# Patient Record
Sex: Male | Born: 1980 | Race: White | Hispanic: No | Marital: Single | Smoking: Current some day smoker
Health system: Southern US, Community
[De-identification: ages and names within clinical notes are randomized; demographics above are authoritative.]

## PROBLEM LIST (undated history)

## (undated) DIAGNOSIS — J45909 Unspecified asthma, uncomplicated: Secondary | ICD-10-CM

## (undated) DIAGNOSIS — R569 Unspecified convulsions: Secondary | ICD-10-CM

## (undated) HISTORY — PX: WISDOM TOOTH EXTRACTION: SHX21

## (undated) HISTORY — DX: Unspecified convulsions: R56.9

---

## 2004-01-02 ENCOUNTER — Emergency Department (HOSPITAL_COMMUNITY): Admission: EM | Admit: 2004-01-02 | Discharge: 2004-01-02 | Payer: Self-pay | Admitting: Emergency Medicine

## 2004-01-07 ENCOUNTER — Emergency Department (HOSPITAL_COMMUNITY): Admission: EM | Admit: 2004-01-07 | Discharge: 2004-01-07 | Payer: Self-pay | Admitting: Emergency Medicine

## 2004-01-17 ENCOUNTER — Encounter: Admission: RE | Admit: 2004-01-17 | Discharge: 2004-01-17 | Payer: Self-pay | Admitting: Gastroenterology

## 2005-04-27 ENCOUNTER — Emergency Department (HOSPITAL_COMMUNITY): Admission: EM | Admit: 2005-04-27 | Discharge: 2005-04-27 | Payer: Self-pay | Admitting: Emergency Medicine

## 2005-06-20 IMAGING — CT CT HEAD W/O CM
1 of 2 series · 13 of 30 positions shown, 17 images · non-contrast
Comparison: none

CLINICAL DATA: generalized malaise, pain; reported clinical concern for meningitis 
 TWO VIEW CHEST
 No priors for comparison.  Lungs are hyperaerated.  No infiltrate.  No cardiomediastinal silhouette and contours.
 IMPRESSION
 Hyperaerated lungs, suspicious for COPD.  
 CT HEAD SCAN WITHOUT CONTRAST MEDIA
 Given the patient?s young age, I feel that there are mild cortical atrophic changes of the cerebrum mainly the frontal lobes and minimal superior cerebellar vermian atrophy.  No acute intracranial abnormality.  No hydrocephalus.  
 No acute intracranial abnormality.  See comments above.

[Series 3: — · axial · 0.43mm/px · z∈[-134,-14]mm · 13 of 30 slices shown, 17 images]
[im 3/30  brain]
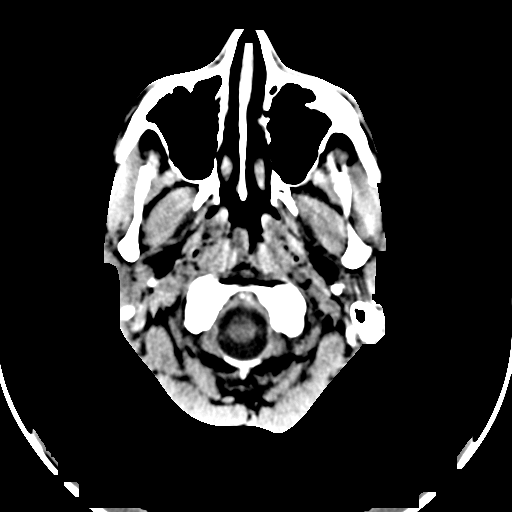
[im 3/30  bone]
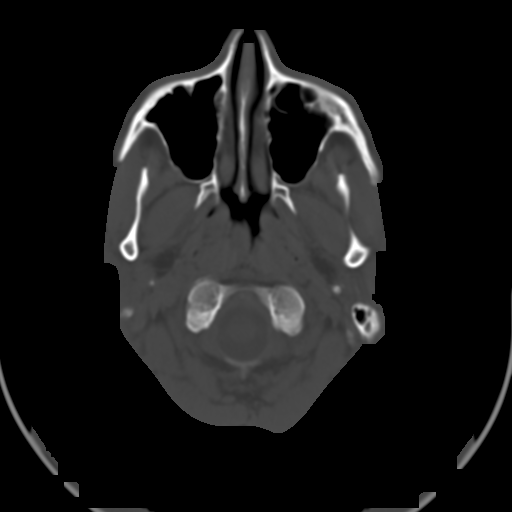
[im 5/30  brain]
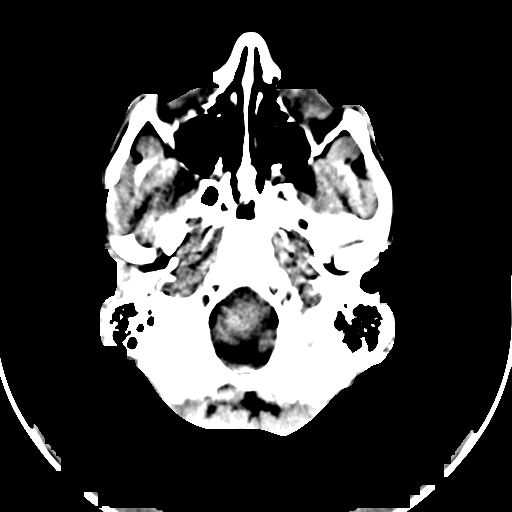
[im 7/30  brain]
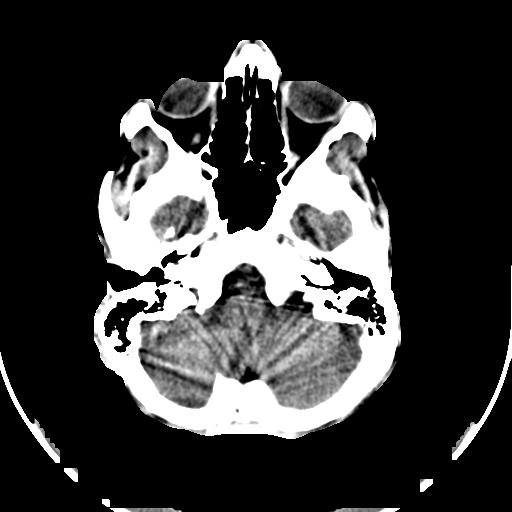
[im 9/30  brain]
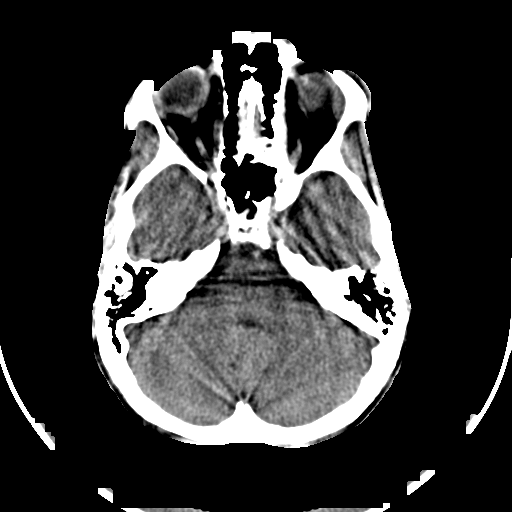
[im 11/30  brain]
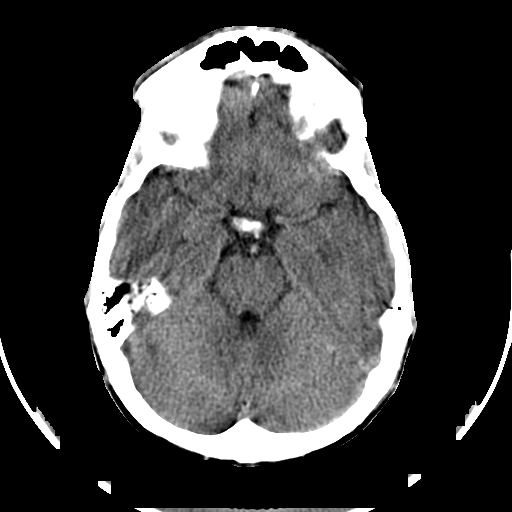
[im 11/30  bone]
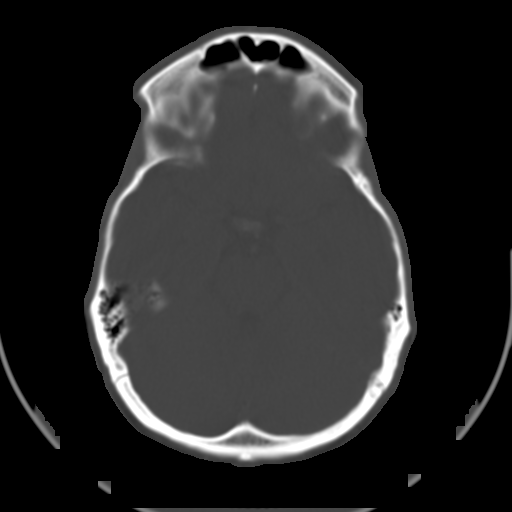
[im 13/30  brain]
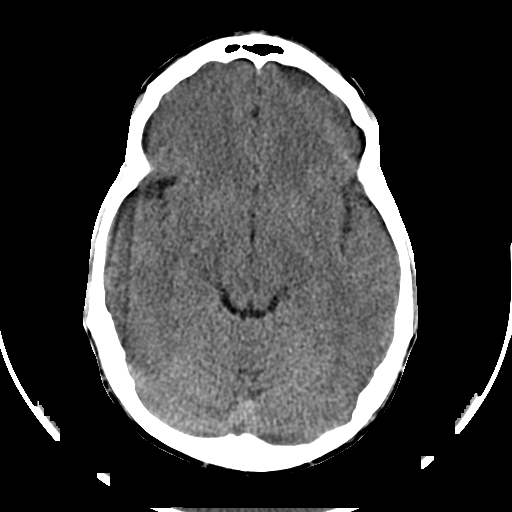
[im 15/30  brain]
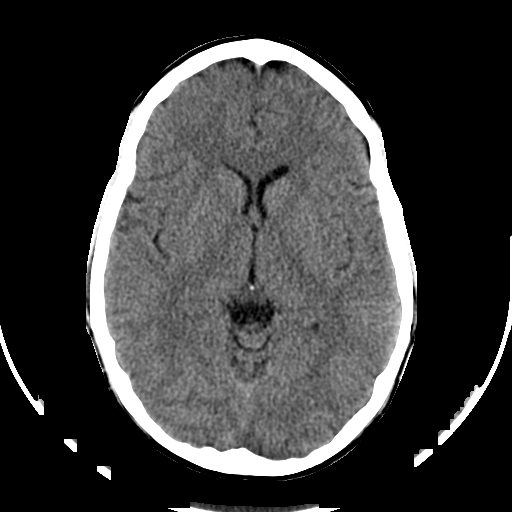
[im 17/30  brain]
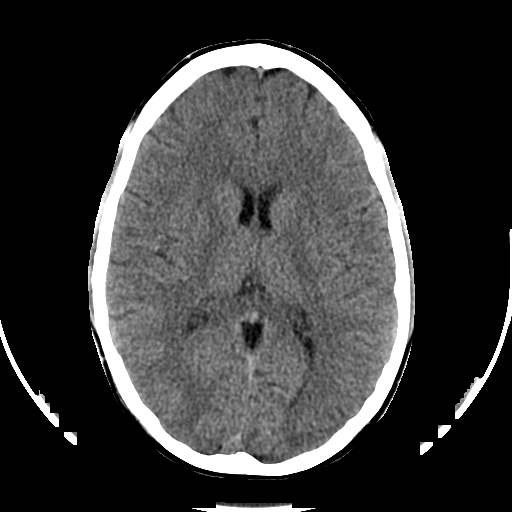
[im 19/30  brain]
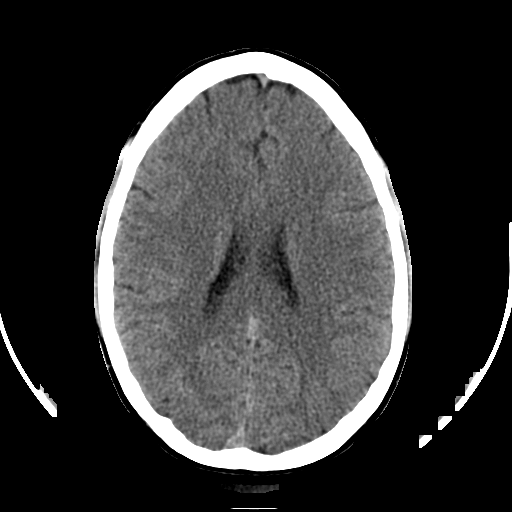
[im 19/30  bone]
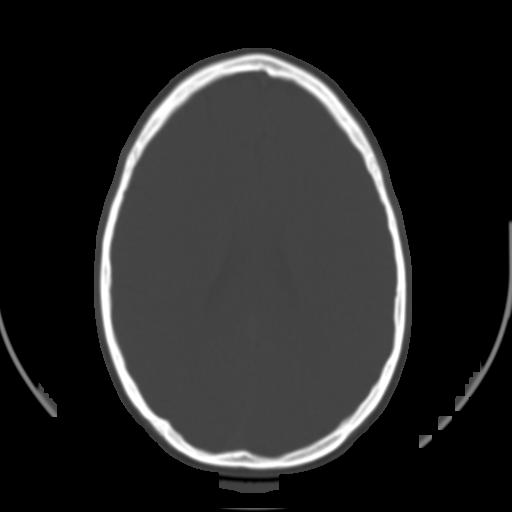
[im 21/30  brain]
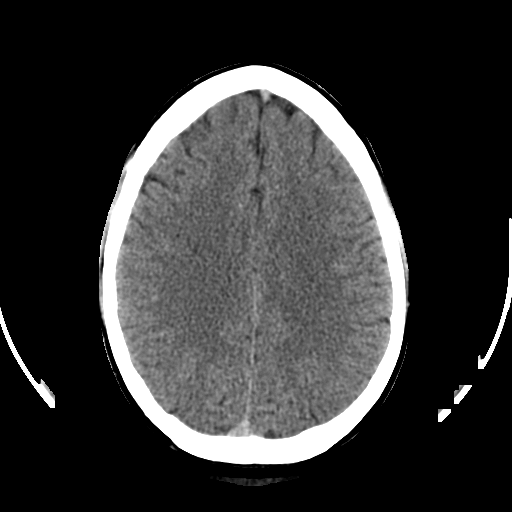
[im 23/30  brain]
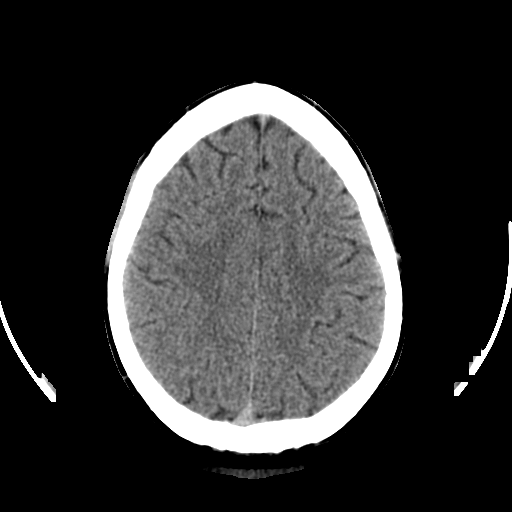
[im 25/30  brain]
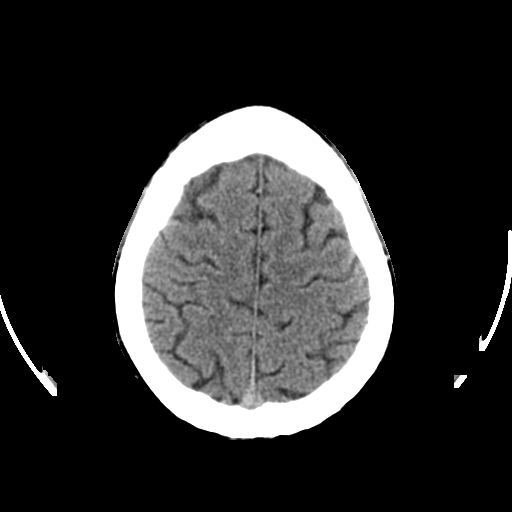
[im 27/30  brain]
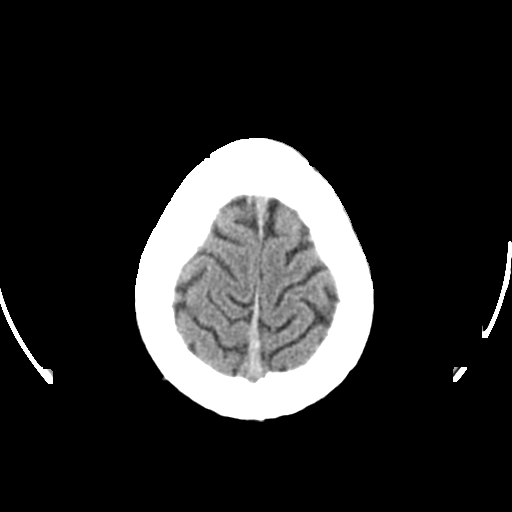
[im 27/30  bone]
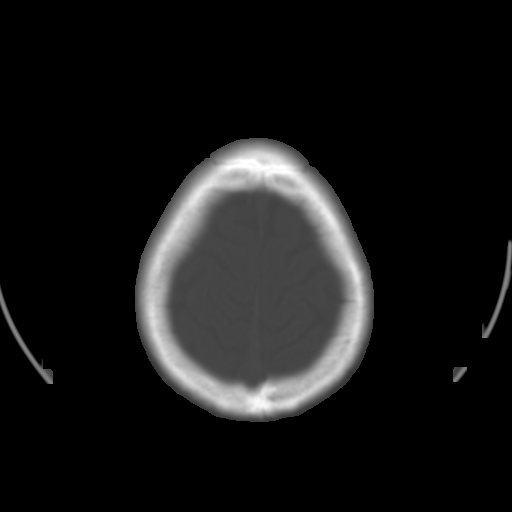

[13 of 30 positions shown; findings below may reference images not displayed]

## 2008-10-12 ENCOUNTER — Emergency Department (HOSPITAL_COMMUNITY): Admission: EM | Admit: 2008-10-12 | Discharge: 2008-10-12 | Payer: Self-pay | Admitting: Emergency Medicine

## 2008-10-18 ENCOUNTER — Inpatient Hospital Stay (HOSPITAL_COMMUNITY): Admission: EM | Admit: 2008-10-18 | Discharge: 2008-10-20 | Payer: Self-pay | Admitting: Emergency Medicine

## 2010-10-19 HISTORY — PX: URETHRA SURGERY: SHX824

## 2010-11-09 ENCOUNTER — Encounter: Payer: Self-pay | Admitting: Gastroenterology

## 2011-03-03 NOTE — H&P (Signed)
NAME:  Erik Hoffman, Erik Hoffman           ACCOUNT NO.:  [id number]   MEDICAL RECORD NO.:  [id number]          PATIENT TYPE:  INP   LOCATION:  1339                         FACILITY:  [facility]   PHYSICIAN:  Sigmund I. Patsi Sears, M.D.DATE OF BIRTH:  03/09/1981   DATE OF ADMISSION:  10/18/2008  DATE OF DISCHARGE:                              HISTORY & PHYSICAL   HISTORY:  Erik Hoffman is a 30 year old single male, was seen at [facility] on December 25 for 2 days of left testicular pain.  The patient had an ultrasound examination, and since had CT scan, which  show left epididymitis.  He was placed on Cipro, currently on  doxycycline.  He has had nausea, vomiting, and continued pain in his  left testicle.  The patient is unable to eat, unable to rest.  He has  had no fever and no chills.   PAST MEDICAL HISTORY:  Noncontributory.   ALLERGIES:  None.   SOCIAL HISTORY:  Tobacco, current.  THC:  Cannabis abuse.   REVIEW OF SYSTEMS:  12 systems otherwise noncontributory.   PHYSICAL EXAM:  CONSTITUTIONAL:  Shows a thin, well-developed, well-  nourished white male in no acute distress.  VITAL SIGNS:  Blood pressure is 115/70, temperature 98.3, pulse 68,  respiratory 18.  NECK:  Supple, nontender.  No nodes.  CHEST:  Clear to P and A.  ABDOMEN:  Soft, decreased bowel sounds without organomegaly or masses.  The patient complains of diffuse tenderness in the left inguinal area,  suprapubic area.  The complains of exquisite tenderness in his left  testicle and the left hemiscrotum.  Examination shows the patient had  mild erythema of his left hemiscrotum.  The testicle was nicely  palpated, and is normal.  The epididymis is tender to palpation.  I do  not feel heat, do not palpate a fluctuant abscess.  EXTREMITIES:  No cyanosis or edema.  PSYCH:  Normal orientation to time, person, place.   ASSESSMENT:  Patient with left epididymitis, poorly responsive to Cipro  and doxycycline.   He is unable to tolerate pain.   PLAN:  Admit for IV Rocephin and pain medication.  Dr. Annabell Howells to see  today.      Sigmund I. Patsi Sears, M.D.  Electronically Signed     SIT/MEDQ  D:  10/18/2008  T:  10/18/2008  Job:  [id number]   cc:   Excell Seltzer. Annabell Howells, M.D.  Fax: [phone]

## 2011-03-06 NOTE — Discharge Summary (Signed)
NAME:  Erik Hoffman, Erik Hoffman           ACCOUNT NO.:  [id number]   MEDICAL RECORD NO.:  [id number]          PATIENT TYPE:  INP   LOCATION:  1339                         FACILITY:  [facility]   PHYSICIAN:  Sigmund I. Patsi Sears, M.D.DATE OF BIRTH:  1981-01-30   DATE OF ADMISSION:  10/18/2008  DATE OF DISCHARGE:  10/20/2008                               DISCHARGE SUMMARY   ADMISSION DIAGNOSES:  Orchitis epididymitis, nausea and vomiting,  hydrocele.   DISCHARGE DIAGNOSIS:  Orchitis epididymitis, nausea and vomiting,  hydrocele.   BRIEF HISTORY:  Erik Hoffman is a 30 year old Caucasian male seen in  [facility] on December 25, for 2 days of left testicular pain.  He  at that time had ultrasound examination and since had a CT scan which  shows left epididymitis.  He was originally placed on Cipro then  switched to doxicycline.  However, he has had persistent nausea,  vomiting and continued pain in his left testicle.  He is unable to eat  and unable to rest.  He denies chills or fever at that time.  He does  admit to marijuana use.   PAST MEDICAL HISTORY:  None.   PAST SURGICAL HISTORY:  None.   ALLERGIES:  None.   HOSPITAL COURSE:  He was admitted on October 18, 2008 for continued  care under Dr. Jethro Bolus for continued left epididymitis poorly  responsive to Cipro and doxycycline.  He was started on IV Rocephin and  pain medications.  During his hospital stay, he continued to improve.  Pain lessened during his stay.  He is urinating well.  Vital signs  remained stable and he has had some nausea and vomiting.  However, that  is improved.  The fullness in the left scrotum remained present and  minimal discomfort with palpation.  He was changed to oral pain  medication and oral Septra.  He tolerated this well and was able to be  discharged home on October 20, 2008.   LABS ON ADMISSION:  December 31, white count 10.9, hemoglobin 15.5,  hematocrit 45.2, platelets 318,000.  Sodium  140, potassium 3.9, chloride  106, CO2 25, glucose 98, BUN 8, creatinine 0.81.   CONDITION ON DISCHARGE:  Improved.   DISCHARGE MEDICATIONS:  Septra DS p.o. b.i.d., Tylox, Phenergan.   ACTIVITY:  As tolerated.  No driving or lifting for 1 week.   DIET:  As tolerated.   WOUND CARE:  Not applicable.   FOLLOW UP:  With Dr. Patsi Sears or Jetta Lout NP-C in 1 week.  signed: 01/10/09 @ 4pm     ______________________________  Alessandra Bevels. Chase Picket, FNP-C      Sigmund I. Patsi Sears, M.D.  Electronically Signed    JML/MEDQ  D:  12/27/2008  T:  12/27/2008  Job:  [id number]

## 2011-07-24 LAB — DIFFERENTIAL
Basophils Absolute: 0.1 10*3/uL (ref 0.0–0.1)
Eosinophils Absolute: 0.2 10*3/uL (ref 0.0–0.7)
Eosinophils Relative: 2 % (ref 0–5)
Lymphocytes Relative: 13 % (ref 12–46)
Lymphs Abs: 1.5 10*3/uL (ref 0.7–4.0)
Lymphs Abs: 1.6 10*3/uL (ref 0.7–4.0)
Monocytes Relative: 9 % (ref 3–12)
Neutro Abs: 8.5 10*3/uL — ABNORMAL HIGH (ref 1.7–7.7)
Neutrophils Relative %: 76 % (ref 43–77)

## 2011-07-24 LAB — COMPREHENSIVE METABOLIC PANEL
BUN: 7 mg/dL (ref 6–23)
CO2: 29 mEq/L (ref 19–32)
Calcium: 9.3 mg/dL (ref 8.4–10.5)
Chloride: 98 mEq/L (ref 96–112)
Creatinine, Ser: 1.06 mg/dL (ref 0.4–1.5)
GFR calc non Af Amer: >60 mL/min (ref >60)
Total Bilirubin: 0.9 mg/dL (ref 0.3–1.2)

## 2011-07-24 LAB — URINALYSIS, ROUTINE W REFLEX MICROSCOPIC
Glucose, UA: NEGATIVE mg/dL
Hgb urine dipstick: NEGATIVE
Ketones, ur: NEGATIVE mg/dL
Nitrite: NEGATIVE
Protein, ur: NEGATIVE mg/dL
Protein, ur: NEGATIVE mg/dL
Urobilinogen, UA: 0.2 mg/dL (ref 0.0–1.0)
pH: 7 (ref 5.0–8.0)

## 2011-07-24 LAB — BASIC METABOLIC PANEL
BUN: 8 mg/dL (ref 6–23)
CO2: 25 mEq/L (ref 19–32)
Chloride: 106 mEq/L (ref 96–112)
Potassium: 3.9 mEq/L (ref 3.5–5.1)

## 2011-07-24 LAB — CBC
HCT: 45.2 % (ref 39.0–52.0)
HCT: 45.7 % (ref 39.0–52.0)
MCHC: 34.3 g/dL (ref 30.0–36.0)
MCV: 95.2 fL (ref 78.0–100.0)
MCV: 95.7 fL (ref 78.0–100.0)
Platelets: 318 10*3/uL (ref 150–400)
RBC: 4.72 MIL/uL (ref 4.22–5.81)
RBC: 4.8 MIL/uL (ref 4.22–5.81)
WBC: 10.9 10*3/uL — ABNORMAL HIGH (ref 4.0–10.5)
WBC: 11.2 10*3/uL — ABNORMAL HIGH (ref 4.0–10.5)

## 2011-07-24 LAB — PROTIME-INR
INR: 0.9 (ref 0.00–1.49)
Prothrombin Time: 12.7 seconds (ref 11.6–15.2)

## 2011-07-24 LAB — URINE MICROSCOPIC-ADD ON

## 2011-07-24 LAB — URINE CULTURE

## 2013-06-25 ENCOUNTER — Emergency Department (HOSPITAL_COMMUNITY): Payer: BC Managed Care – PPO

## 2013-06-25 ENCOUNTER — Emergency Department (HOSPITAL_COMMUNITY)
Admission: EM | Admit: 2013-06-25 | Discharge: 2013-06-25 | Disposition: A | Discharge status: Home / Self Care | Payer: BC Managed Care – PPO | Attending: Emergency Medicine | Admitting: Emergency Medicine

## 2013-06-25 ENCOUNTER — Encounter (HOSPITAL_COMMUNITY): Payer: Self-pay | Admitting: *Deleted

## 2013-06-25 DIAGNOSIS — S0990XA Unspecified injury of head, initial encounter: Secondary | ICD-10-CM | POA: Insufficient documentation

## 2013-06-25 DIAGNOSIS — S0993XA Unspecified injury of face, initial encounter: Secondary | ICD-10-CM | POA: Insufficient documentation

## 2013-06-25 DIAGNOSIS — F1010 Alcohol abuse, uncomplicated: Secondary | ICD-10-CM | POA: Insufficient documentation

## 2013-06-25 DIAGNOSIS — S3981XA Other specified injuries of abdomen, initial encounter: Secondary | ICD-10-CM | POA: Insufficient documentation

## 2013-06-25 DIAGNOSIS — F10929 Alcohol use, unspecified with intoxication, unspecified: Secondary | ICD-10-CM

## 2013-06-25 DIAGNOSIS — Y92410 Unspecified street and highway as the place of occurrence of the external cause: Secondary | ICD-10-CM | POA: Insufficient documentation

## 2013-06-25 DIAGNOSIS — IMO0002 Reserved for concepts with insufficient information to code with codable children: Secondary | ICD-10-CM | POA: Insufficient documentation

## 2013-06-25 DIAGNOSIS — F17200 Nicotine dependence, unspecified, uncomplicated: Secondary | ICD-10-CM | POA: Insufficient documentation

## 2013-06-25 DIAGNOSIS — J45909 Unspecified asthma, uncomplicated: Secondary | ICD-10-CM | POA: Insufficient documentation

## 2013-06-25 DIAGNOSIS — Y9389 Activity, other specified: Secondary | ICD-10-CM | POA: Insufficient documentation

## 2013-06-25 DIAGNOSIS — S8990XA Unspecified injury of unspecified lower leg, initial encounter: Secondary | ICD-10-CM | POA: Insufficient documentation

## 2013-06-25 HISTORY — DX: Unspecified asthma, uncomplicated: J45.909

## 2013-06-25 LAB — POCT I-STAT, CHEM 8
BUN: 13 mg/dL (ref 6–23)
Chloride: 103 mEq/L (ref 96–112)
Glucose, Bld: 91 mg/dL (ref 70–99)
HCT: 43 % (ref 39.0–52.0)
Potassium: 4.1 mEq/L (ref 3.5–5.1)

## 2013-06-25 MED ORDER — DIAZEPAM 5 MG PO TABS: 5.0000 mg | ORAL_TABLET | Freq: Three times a day (TID) | ORAL | Status: DC | PRN

## 2013-06-25 MED ORDER — IOHEXOL 300 MG/ML  SOLN
100.0000 mL | Freq: Once | INTRAMUSCULAR | Status: AC | PRN
  Administered 2013-06-25: 100 mL via INTRAVENOUS

## 2013-06-25 MED ORDER — IBUPROFEN 800 MG PO TABS: 800.0000 mg | ORAL_TABLET | Freq: Three times a day (TID) | ORAL | Status: DC | PRN
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
## 2013-06-25 NOTE — ED Provider Notes (Signed)
Medical screening examination/treatment/procedure(s) were performed by non-physician practitioner and as supervising physician I was immediately available for consultation/collaboration.  Toy Baker, MD 06/25/13 2032
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
## 2013-12-29 ENCOUNTER — Emergency Department: Payer: Self-pay | Admitting: Emergency Medicine

## 2013-12-29 LAB — CBC
HCT: 44.4 % (ref 40.0–52.0)
HGB: 15.4 g/dL (ref 13.0–18.0)
MCH: 31.2 pg (ref 26.0–34.0)
MCHC: 34.6 g/dL (ref 32.0–36.0)
MCV: 90 fL (ref 80–100)
Platelet: 248 10*3/uL (ref 150–440)
RBC: 4.92 10*6/uL (ref 4.40–5.90)
RDW: 13.6 % (ref 11.5–14.5)
WBC: 8.2 10*3/uL (ref 3.8–10.6)

## 2013-12-29 LAB — COMPREHENSIVE METABOLIC PANEL
ALBUMIN: 4.2 g/dL (ref 3.4–5.0)
ALK PHOS: 53 U/L
ALT: 30 U/L (ref 12–78)
Anion Gap: 11 (ref 7–16)
BUN: 12 mg/dL (ref 7–18)
Bilirubin,Total: 0.5 mg/dL (ref 0.2–1.0)
CHLORIDE: 107 mmol/L (ref 98–107)
CREATININE: 1.49 mg/dL — AB (ref 0.60–1.30)
Calcium, Total: 8.7 mg/dL (ref 8.5–10.1)
Co2: 20 mmol/L — ABNORMAL LOW (ref 21–32)
EGFR (Non-African Amer.): >60
GLUCOSE: 94 mg/dL (ref 65–99)
Osmolality: 275 (ref 275–301)
Potassium: 3.9 mmol/L (ref 3.5–5.1)
SGOT(AST): 21 U/L (ref 15–37)
Sodium: 138 mmol/L (ref 136–145)
Total Protein: 7.8 g/dL (ref 6.4–8.2)

## 2013-12-29 LAB — DRUG SCREEN, URINE
Amphetamines, Ur Screen: NEGATIVE (ref ?–1000)
BARBITURATES, UR SCREEN: NEGATIVE (ref ?–200)
Benzodiazepine, Ur Scrn: NEGATIVE (ref ?–200)
Cannabinoid 50 Ng, Ur ~~LOC~~: POSITIVE (ref ?–50)
Cocaine Metabolite,Ur ~~LOC~~: NEGATIVE (ref ?–300)
MDMA (ECSTASY) UR SCREEN: NEGATIVE (ref ?–500)
Methadone, Ur Screen: NEGATIVE (ref ?–300)
OPIATE, UR SCREEN: POSITIVE (ref ?–300)
Phencyclidine (PCP) Ur S: NEGATIVE (ref ?–25)
TRICYCLIC, UR SCREEN: NEGATIVE (ref ?–1000)

## 2013-12-29 LAB — TROPONIN I: Troponin-I: <0.02 ng/mL

## 2013-12-30 LAB — URINALYSIS, COMPLETE
BACTERIA: NONE SEEN
BILIRUBIN, UR: NEGATIVE
Blood: NEGATIVE
Glucose,UR: NEGATIVE mg/dL (ref 0–75)
Ketone: NEGATIVE
Leukocyte Esterase: NEGATIVE
Nitrite: NEGATIVE
PH: 5 (ref 4.5–8.0)
PROTEIN: NEGATIVE
RBC,UR: 1 /HPF (ref 0–5)
Specific Gravity: 1.019 (ref 1.003–1.030)
WBC UR: 2 /HPF (ref 0–5)

## 2014-09-30 ENCOUNTER — Emergency Department: Payer: Self-pay | Admitting: Emergency Medicine

## 2015-01-24 ENCOUNTER — Ambulatory Visit: Payer: 59 | Admitting: Neurology

## 2015-01-24 ENCOUNTER — Telehealth: Payer: Self-pay

## 2015-01-24 NOTE — Telephone Encounter (Signed)
Patient did not come to a new patient appointment. 

## 2015-01-28 ENCOUNTER — Encounter: Payer: Self-pay | Admitting: Neurology

## 2015-02-12 ENCOUNTER — Encounter: Payer: Self-pay | Admitting: Neurology

## 2015-02-12 ENCOUNTER — Ambulatory Visit (INDEPENDENT_AMBULATORY_CARE_PROVIDER_SITE_OTHER): Payer: 59 | Admitting: Neurology

## 2015-02-12 VITALS — BP 104/70 | HR 59 | Resp 16 | Ht 69.0 in | Wt 186.0 lb

## 2015-02-12 DIAGNOSIS — G40309 Generalized idiopathic epilepsy and epileptic syndromes, not intractable, without status epilepticus: Secondary | ICD-10-CM

## 2015-02-12 MED ORDER — VALPROIC ACID 500 MG PO CPDR: DELAYED_RELEASE_CAPSULE | ORAL | Status: DC
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
## 2015-02-12 NOTE — Patient Instructions (Addendum)
1. Start Depakote DR 500mg  twice a day 2. Practice good sleep hygiene, start melatonin 3mg  daily 3. Records from Dr. Malvin JohnsPotter will be requested for review 4. Follow-up in 2 months, call for any problems  Seizure Precautions: 1. If medication has been prescribed for you to prevent seizures, take it exactly as directed.  Do not stop taking the medicine without talking to your doctor first, even if you have not had a seizure in a long time.   2. Avoid activities in which a seizure would cause danger to yourself or to others.  Don't operate dangerous machinery, swim alone, or climb in high or dangerous places, such as on ladders, roofs, or girders.  Do not drive unless your doctor says you may.  3. If you have any warning that you may have a seizure, lay down in a safe place where you can't hurt yourself.    4.  No driving for 6 months from last seizure, as per [place] law.   Please refer to the following link on the Epile[employer] of [employer]'s website for more information: http://www.epilepsyfoundation.org/answerplace/Social/driving/drivingu.cfm   5.  Maintain good sleep hygiene.  6.  Contact your doctor if you have any problems that may be related to the medicine you are taking.  7.  Call 911 and bring the patient back to the ED if:        A.  The seizure lasts longer than 5 minutes.       B.  The patient doesn't awaken shortly after the seizure  C.  The patient has new problems such as difficulty seeing, speaking or moving  D.  The patient was injured during the seizure  E.  The patient has a temperature over 102 F (39C)  F.  The patient vomited and now is having trouble breathing

## 2015-03-05 ENCOUNTER — Telehealth: Payer: Self-pay | Admitting: Neurology

## 2015-03-05 NOTE — Telephone Encounter (Signed)
Review[facility]: EEG done 02/09/14 was abnormal with frequent approximately 1 second bursts of generalized 5Hz  spike and wave discharges. Hyperventilation induces spike wave activity. 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

## 2015-04-02 ENCOUNTER — Ambulatory Visit: Payer: 59 | Admitting: Neurology

## 2015-04-02 DIAGNOSIS — Z029 Encounter for administrative examinations, unspecified: Secondary | ICD-10-CM

## 2015-04-03 ENCOUNTER — Encounter: Payer: Self-pay | Admitting: Neurology

## 2016-03-01 ENCOUNTER — Encounter (HOSPITAL_COMMUNITY): Payer: Self-pay

## 2016-03-01 ENCOUNTER — Emergency Department (HOSPITAL_COMMUNITY)
Admission: EM | Admit: 2016-03-01 | Discharge: 2016-03-01 | Disposition: A | Discharge status: Home / Self Care | Payer: No Typology Code available for payment source | Attending: Emergency Medicine | Admitting: Emergency Medicine

## 2016-03-01 DIAGNOSIS — G40909 Epilepsy, unspecified, not intractable, without status epilepticus: Secondary | ICD-10-CM | POA: Insufficient documentation

## 2016-03-01 DIAGNOSIS — J45909 Unspecified asthma, uncomplicated: Secondary | ICD-10-CM | POA: Insufficient documentation

## 2016-03-01 DIAGNOSIS — R569 Unspecified convulsions: Secondary | ICD-10-CM

## 2016-03-01 DIAGNOSIS — F17210 Nicotine dependence, cigarettes, uncomplicated: Secondary | ICD-10-CM | POA: Insufficient documentation

## 2016-03-01 DIAGNOSIS — Z79899 Other long term (current) drug therapy: Secondary | ICD-10-CM | POA: Insufficient documentation

## 2016-03-01 LAB — CBC WITH DIFFERENTIAL/PLATELET
Basophils Absolute: 0.1 10*3/uL (ref 0.0–0.1)
Basophils Relative: 1 %
EOS ABS: 0.1 10*3/uL (ref 0.0–0.7)
EOS PCT: 1 %
HCT: 41.5 % (ref 39.0–52.0)
Hemoglobin: 14.3 g/dL (ref 13.0–17.0)
LYMPHS ABS: 1.1 10*3/uL (ref 0.7–4.0)
Lymphocytes Relative: 10 %
MCH: 31.5 pg (ref 26.0–34.0)
MCHC: 34.5 g/dL (ref 30.0–36.0)
MCV: 91.4 fL (ref 78.0–100.0)
MONOS PCT: 9 %
Monocytes Absolute: 0.9 10*3/uL (ref 0.1–1.0)
Neutro Abs: 8.6 10*3/uL — ABNORMAL HIGH (ref 1.7–7.7)
Neutrophils Relative %: 79 %
PLATELETS: 227 10*3/uL (ref 150–400)
RBC: 4.54 MIL/uL (ref 4.22–5.81)
RDW: 12.8 % (ref 11.5–15.5)
WBC: 10.8 10*3/uL — AB (ref 4.0–10.5)

## 2016-03-01 LAB — BASIC METABOLIC PANEL
Anion gap: 7 (ref 5–15)
BUN: 15 mg/dL (ref 6–20)
CALCIUM: 8.8 mg/dL — AB (ref 8.9–10.3)
CHLORIDE: 105 mmol/L (ref 101–111)
CO2: 27 mmol/L (ref 22–32)
CREATININE: 1.04 mg/dL (ref 0.61–1.24)
GFR calc non Af Amer: >60 mL/min (ref >60)
Glucose, Bld: 113 mg/dL — ABNORMAL HIGH (ref 65–99)
Potassium: 4.3 mmol/L (ref 3.5–5.1)
SODIUM: 139 mmol/L (ref 135–145)

## 2016-03-01 LAB — CBG MONITORING, ED: Glucose-Capillary: 108 mg/dL — ABNORMAL HIGH (ref 65–99)

## 2016-03-01 NOTE — ED Notes (Signed)
Patient was at home and had a seizure around 0930 or 1000. Patient's family stated they saw patient post seizure and called EMS. Patient refused to be transported to the ED. Patient c/o headache. No tongue injury.

## 2016-03-01 NOTE — Discharge Instructions (Signed)
Follow-up with your neurologist this week. Call on Monday to arrange this appointment.   Seizure, Adult A seizure is abnormal electrical activity in the brain. Seizures usually last from 30 seconds to 2 minutes. There are various types of seizures. Before a seizure, you may have a warning sensation (aura) that a seizure is about to occur. An aura may include the following symptoms:   Fear or anxiety.  Nausea.  Feeling like the room is spinning (vertigo).  Vision changes, such as seeing flashing lights or spots. Common symptoms during a seizure include:  A change in attention or behavior (altered mental status).  Convulsions with rhythmic jerking movements.  Drooling.  Rapid eye movements.  Grunting.  Loss of bladder and bowel control.  Bitter taste in the mouth.  Tongue biting. After a seizure, you may feel confused and sleepy. You may also have an injury resulting from convulsions during the seizure. HOME CARE INSTRUCTIONS   If you are given medicines, take them exactly as prescribed by your health care provider.  Keep all follow-up appointments as directed by your health care provider.  Do not swim or drive or engage in risky activity during which a seizure could cause further injury to you or others until your health care provider says it is OK.  Get adequate rest.  Teach friends and family what to do if you have a seizure. They should:  Lay you on the ground to prevent a fall.  Put a cushion under your head.  Loosen any tight clothing around your neck.  Turn you on your side. If vomiting occurs, this helps keep your airway clear.  Stay with you until you recover.  Know whether or not you need emergency care. SEEK IMMEDIATE MEDICAL CARE IF:  The seizure lasts longer than 5 minutes.  The seizure is severe or you do not wake up immediately after the seizure.  You have an altered mental status after the seizure.  You are having more frequent or worsening  seizures. Someone should drive you to the emergency department or call local emergency services (911 in [place] MAKE SURE YOU:  Understand these instructions.  Will watch your condition.  Will get help right away if you are not doing well or get worse.   This information is not intended to replace advice given to you by your health care provider. Make sure you discuss any questions you have with your health care provider.   Document Released: 10/02/2000 Document Revised: 10/26/2014 Document Reviewed: 05/17/2013 Elsevier Interactive Patient Education [employer]2016 [employer]vier Inc.

## 2016-03-01 NOTE — ED Provider Notes (Signed)
CSN: [id number]     Arrival date & time 03/01/16  1048 History   First MD Initiated Contact with Patient 03/01/16 1153     Chief Complaint  Patient presents with  . Seizures     (Consider location/radiation/quality/duration/timing/severity/associated sxs/prior Treatment) HPI Comments: Patient is a 35 year old male with history of seizures. Her seizure was 2 years ago. He was initially started on Keppra she could not afford, then changed to Depakote which made him feel bad, so he has not been taking this for approximately one year. He reports starting a new job recently and this has interrupted his sleep patterns. This morning he was found by his family to be having a grand mal seizure. He reports shaking all over and foaming at the mouth. They also report that he turned blue. EMS was called and this resolved in approximately 10 minutes. Patient currently has no complaints.  Patient is a 35 y.o. male presenting with seizures. The history is provided by the patient.  Seizures Seizure activity on arrival: no   Seizure type:  Grand mal Preceding symptoms: no headache, no nausea and no vision change   Initial focality:  None Postictal symptoms: no confusion   Severity:  Moderate Duration:  10 minutes Timing:  Once Progression:  Resolved Recent head injury:  No recent head injuries PTA treatment:  None History of seizures: yes   Similar to previous episodes: yes     Past Medical History  Diagnosis Date  . Asthma   . Seizure [facility])    Past Surgical History  Procedure Laterality Date  . Urethra surgery  2012  . Wisdom tooth extraction     Family History  Problem Relation Age of Onset  . Hypothyroidism Mother   . Mesothelioma Father    Social History  Substance Use Topics  . Smoking status: Current Every Day Smoker -- 0.50 packs/day for 15 years    Types: Cigarettes  . Smokeless tobacco: Never Used  . Alcohol Use: 0.0 oz/week    0 Standard drinks or equivalent per week   Comment: Rare    Review of Systems  Neurological: Positive for seizures.  All other systems reviewed and are negative.     Allergies  Review of patient's allergies indicates no known allergies.  Home Medications   Prior to Admission medications   Medication Sig Start Date End Date Taking? Authorizing Provider  MELATONIN PO Take 1 capsule by mouth at bedtime as needed (sleep).   Yes Historical Provider, MD  Valproic Acid 500 MG CPDR Take 1 capsule twice a day 02/12/15  Yes Van Clines, MD   BP 111/78 mmHg  Pulse 76  Temp(Src) 97.9 F (36.6 C) (Oral)  Resp 17  SpO2 98% Physical Exam  Constitutional: He is oriented to person, place, and time. He appears well-developed and well-nourished. No distress.  HENT:  Head: Normocephalic and atraumatic.  Mouth/Throat: Oropharynx is clear and moist.  There is no evidence for oral trauma.  Neck: Normal range of motion. Neck supple.  Cardiovascular: Normal rate and regular rhythm.  Exam reveals no friction rub.   No murmur heard. Pulmonary/Chest: Effort normal and breath sounds normal. No respiratory distress. He has no wheezes. He has no rales.  Abdominal: Soft. Bowel sounds are normal. He exhibits no distension. There is no tenderness.  Musculoskeletal: Normal range of motion. He exhibits no edema.  Neurological: He is alert and oriented to person, place, and time. No cranial nerve deficit. He exhibits normal muscle tone. Coordination  normal.  Skin: Skin is warm and dry. He is not diaphoretic.  Nursing note and vitals reviewed.   ED Course  Procedures (including critical care time) Labs Review Labs Reviewed  BASIC METABOLIC PANEL - Abnormal; Notable for the following:    Glucose, Bld 113 (*)    Calcium 8.8 (*)    All other components within normal limits  CBC WITH DIFFERENTIAL/PLATELET - Abnormal; Notable for the following:    WBC 10.8 (*)    Neutro Abs 8.6 (*)    All other components within normal limits  CBG MONITORING,  ED - Abnormal; Notable for the following:    Glucose-Capillary 108 (*)    All other components within normal limits  CBG MONITORING, ED    Imaging Review No results found. I have personally reviewed and evaluated these images and lab results as part of my medical decision-making.   EKG Interpretation   Date/Time:  Sunday Mar 01 2016 11:02:23 EDT Ventricular Rate:  72 PR Interval:  149 QRS Duration: 90 QT Interval:  388 QTC Calculation: 425 R Axis:   79 Text Interpretation:  Sinus rhythm ST elev, probable normal early repol  pattern since last tracing no significant change Confirmed by Effie ShyWENTZ  MD,  ELLIOTT ([id number]) on 03/01/2016 11:05:45 AM      MDM   Final diagnoses:  None    Patient was brought here after a seizure by EMS. His workup reveals normal laboratory studies. He has had a full seizure workup in the past, however no cause was found. He is noncompliant with his medications and has informed me that he will not take anything that is prescribed and is requesting and "herbal medication." I discussed this case with Dr. Lavon PaganiniNandigam from neurology who is recommending outpatient follow-up. Again, the patient declines any prescription medication. He understands that this could result in another seizure and possibly disability/death. He is to follow-up with his neurologist.    Erik Lyonsouglas Jazir Newey, MD 03/01/16 1425

## 2016-03-01 NOTE — ED Notes (Signed)
Patient states he is suppose to be taking depakote, but not taking medication . Patient states he has not had a seizure in a year.

## 2016-03-04 ENCOUNTER — Encounter: Payer: Self-pay | Admitting: Diagnostic Neuroimaging

## 2016-03-04 ENCOUNTER — Ambulatory Visit (INDEPENDENT_AMBULATORY_CARE_PROVIDER_SITE_OTHER): Payer: BLUE CROSS/BLUE SHIELD | Admitting: Diagnostic Neuroimaging

## 2016-03-04 VITALS — BP 128/79 | HR 66 | Ht 69.0 in | Wt 183.8 lb

## 2016-03-04 DIAGNOSIS — G40309 Generalized idiopathic epilepsy and epileptic syndromes, not intractable, without status epilepticus: Secondary | ICD-10-CM

## 2016-03-04 MED ORDER — DIVALPROEX SODIUM 500 MG PO DR TAB: 500.0000 mg | DELAYED_RELEASE_TABLET | Freq: Two times a day (BID) | ORAL | Status: DC
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
## 2016-03-04 NOTE — Patient Instructions (Signed)
Thank you for coming to see [place] at [facility]. I hope we have been able to provide you high quality care today.  You may receive a patient satisfaction survey over the next few weeks. We would appreciate your feedback and comments so that we may continue to improve ourselves and the health of our patients.  - start divalproex 582m twice a day - no driving until seizure free x 6 months   ~~~~~~~~~~~~~~~~~~~~~~~~~~~~~~~~~~~~~~~~~~~~~~~~~~~~~~~~~~~~~~~~~  DR. PENUMALLI'S GUIDE TO HAPPY AND HEALTHY LIVING These are some of my general health and wellness recommendations. Some of them may apply to you better than others. Please use common sense as you try these suggestions and feel free to ask me any questions.   ACTIVITY/FITNESS Mental, social, emotional and physical stimulation are very important for brain and body health. Try learning a new activity (arts, music, language, sports, games).  Keep moving your body to the best of your abilities. You can do this at home, inside or outside, the park, community center, gym or anywhere you like. Consider a physical therapist or personal trainer to get started. Consider the app Sworkit. Fitness trackers such as smart-watches, smart-phones or Fitbits can help as well.   NUTRITION Eat more plants: colorful vegetables, nuts, seeds and berries.  Eat less sugar, salt, preservatives and processed foods.  Avoid toxins such as cigarettes and alcohol.  Drink water when you are thirsty. Warm water with a slice of lemon is an excellent morning drink to start the day.  Consider these websites for more information The Nutrition Source (hhttps://www.henry-hernandez.biz/ [employer] (wWindowBlog.ch   RELAXATION Consider practicing mindfulness meditation or other relaxation techniques such as deep breathing, prayer, yoga, tai chi, massage. See website mindful.org or the apps Headspace or  Calm to help get started.   SLEEP Try to get at least 7-8+ hours sleep per day. Regular exercise and reduced caffeine will help you sleep better. Practice good sleep hygeine techniques. See website sleep.org for more information.   PLANNING Prepare estate planning, living will, healthcare POA documents. Sometimes this is best planned with the help of an attorney. Theconversationproject.org and agingwithdignity.org are excellent resources.

## 2016-03-05 ENCOUNTER — Encounter: Payer: Self-pay | Admitting: Diagnostic Neuroimaging

## 2016-06-05 ENCOUNTER — Ambulatory Visit (INDEPENDENT_AMBULATORY_CARE_PROVIDER_SITE_OTHER): Payer: BLUE CROSS/BLUE SHIELD | Admitting: Diagnostic Neuroimaging

## 2016-06-05 ENCOUNTER — Encounter: Payer: Self-pay | Admitting: Diagnostic Neuroimaging

## 2016-06-05 VITALS — BP 125/83 | HR 89 | Wt 170.6 lb

## 2016-06-05 DIAGNOSIS — G40309 Generalized idiopathic epilepsy and epileptic syndromes, not intractable, without status epilepticus: Secondary | ICD-10-CM

## 2016-06-05 MED ORDER — DIVALPROEX SODIUM 500 MG PO DR TAB: 500.0000 mg | DELAYED_RELEASE_TABLET | Freq: Two times a day (BID) | ORAL | 4 refills | Status: DC

## 2016-06-05 NOTE — Patient Instructions (Signed)
continue current medication
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
## 2016-06-06 LAB — CBC WITH DIFFERENTIAL/PLATELET
BASOS ABS: 0.2 10*3/uL (ref 0.0–0.2)
Basos: 2 %
EOS (ABSOLUTE): 0.2 10*3/uL (ref 0.0–0.4)
Eos: 2 %
Hematocrit: 42.5 % (ref 37.5–51.0)
Hemoglobin: 14.1 g/dL (ref 12.6–17.7)
Immature Grans (Abs): 0 10*3/uL (ref 0.0–0.1)
Immature Granulocytes: 1 %
LYMPHS ABS: 2 10*3/uL (ref 0.7–3.1)
Lymphs: 26 %
MCH: 30.8 pg (ref 26.6–33.0)
MCHC: 33.2 g/dL (ref 31.5–35.7)
MCV: 93 fL (ref 79–97)
MONOCYTES: 7 %
MONOS ABS: 0.5 10*3/uL (ref 0.1–0.9)
Neutrophils Absolute: 4.6 10*3/uL (ref 1.4–7.0)
Neutrophils: 62 %
PLATELETS: 271 10*3/uL (ref 150–379)
RBC: 4.58 x10E6/uL (ref 4.14–5.80)
RDW: 13.5 % (ref 12.3–15.4)
WBC: 7.4 10*3/uL (ref 3.4–10.8)

## 2016-06-06 LAB — COMPREHENSIVE METABOLIC PANEL
ALK PHOS: 44 IU/L (ref 39–117)
ALT: 8 IU/L (ref 0–44)
AST: 8 IU/L (ref 0–40)
Albumin/Globulin Ratio: 2 (ref 1.2–2.2)
Albumin: 4.3 g/dL (ref 3.5–5.5)
BUN/Creatinine Ratio: 10 (ref 9–20)
BUN: 11 mg/dL (ref 6–20)
Bilirubin Total: 0.4 mg/dL (ref 0.0–1.2)
CO2: 24 mmol/L (ref 18–29)
CREATININE: 1.1 mg/dL (ref 0.76–1.27)
Calcium: 9.1 mg/dL (ref 8.7–10.2)
Chloride: 101 mmol/L (ref 96–106)
GFR calc Af Amer: 101 mL/min/{1.73_m2} (ref >59)
GFR calc non Af Amer: 87 mL/min/{1.73_m2} (ref >59)
GLUCOSE: 144 mg/dL — AB (ref 65–99)
Globulin, Total: 2.2 g/dL (ref 1.5–4.5)
Potassium: 4.3 mmol/L (ref 3.5–5.2)
Sodium: 141 mmol/L (ref 134–144)
Total Protein: 6.5 g/dL (ref 6.0–8.5)

## 2016-06-08 ENCOUNTER — Telehealth: Payer: Self-pay | Admitting: *Deleted

## 2016-06-08 NOTE — Telephone Encounter (Signed)
Per Dr Marjory LiesPenumalli, spoke with patient and informed him his lab results are good with the exception of somewhat elevated sugar/glucose. Advised he watch his sugar intake both with foods and drinks.  He verbalized understanding, appreciation.

## 2016-12-07 ENCOUNTER — Ambulatory Visit: Payer: BLUE CROSS/BLUE SHIELD | Admitting: Diagnostic Neuroimaging

## 2016-12-23 ENCOUNTER — Emergency Department (HOSPITAL_COMMUNITY): Payer: BLUE CROSS/BLUE SHIELD

## 2016-12-23 ENCOUNTER — Encounter (HOSPITAL_COMMUNITY): Payer: Self-pay

## 2016-12-23 ENCOUNTER — Emergency Department (HOSPITAL_COMMUNITY)
Admission: EM | Admit: 2016-12-23 | Discharge: 2016-12-23 | Disposition: A | Discharge status: Home / Self Care | Payer: BLUE CROSS/BLUE SHIELD | Attending: Emergency Medicine | Admitting: Emergency Medicine

## 2016-12-23 DIAGNOSIS — W228XXA Striking against or struck by other objects, initial encounter: Secondary | ICD-10-CM | POA: Diagnosis not present

## 2016-12-23 DIAGNOSIS — Y939 Activity, unspecified: Secondary | ICD-10-CM | POA: Diagnosis not present

## 2016-12-23 DIAGNOSIS — Y929 Unspecified place or not applicable: Secondary | ICD-10-CM | POA: Insufficient documentation

## 2016-12-23 DIAGNOSIS — J45909 Unspecified asthma, uncomplicated: Secondary | ICD-10-CM | POA: Insufficient documentation

## 2016-12-23 DIAGNOSIS — Z79899 Other long term (current) drug therapy: Secondary | ICD-10-CM | POA: Diagnosis not present

## 2016-12-23 DIAGNOSIS — R0781 Pleurodynia: Secondary | ICD-10-CM | POA: Diagnosis not present

## 2016-12-23 DIAGNOSIS — R51 Headache: Secondary | ICD-10-CM | POA: Insufficient documentation

## 2016-12-23 DIAGNOSIS — Z23 Encounter for immunization: Secondary | ICD-10-CM | POA: Insufficient documentation

## 2016-12-23 DIAGNOSIS — R569 Unspecified convulsions: Secondary | ICD-10-CM | POA: Diagnosis not present

## 2016-12-23 DIAGNOSIS — Y999 Unspecified external cause status: Secondary | ICD-10-CM | POA: Insufficient documentation

## 2016-12-23 DIAGNOSIS — F17210 Nicotine dependence, cigarettes, uncomplicated: Secondary | ICD-10-CM | POA: Insufficient documentation

## 2016-12-23 DIAGNOSIS — W19XXXA Unspecified fall, initial encounter: Secondary | ICD-10-CM

## 2016-12-23 LAB — CBC WITH DIFFERENTIAL/PLATELET
BASOS ABS: 0.1 10*3/uL (ref 0.0–0.1)
BASOS PCT: 1 %
EOS ABS: 0 10*3/uL (ref 0.0–0.7)
EOS PCT: 0 %
HCT: 41.5 % (ref 39.0–52.0)
Hemoglobin: 14.9 g/dL (ref 13.0–17.0)
LYMPHS PCT: 7 %
Lymphs Abs: 1.2 10*3/uL (ref 0.7–4.0)
MCH: 31.6 pg (ref 26.0–34.0)
MCHC: 35.9 g/dL (ref 30.0–36.0)
MCV: 88.1 fL (ref 78.0–100.0)
Monocytes Absolute: 1 10*3/uL (ref 0.1–1.0)
Monocytes Relative: 6 %
Neutro Abs: 14.3 10*3/uL — ABNORMAL HIGH (ref 1.7–7.7)
Neutrophils Relative %: 86 %
PLATELETS: 285 10*3/uL (ref 150–400)
RBC: 4.71 MIL/uL (ref 4.22–5.81)
RDW: 13.3 % (ref 11.5–15.5)
WBC: 16.6 10*3/uL — AB (ref 4.0–10.5)

## 2016-12-23 LAB — COMPREHENSIVE METABOLIC PANEL
ALBUMIN: 4.5 g/dL (ref 3.5–5.0)
ALT: 28 U/L (ref 17–63)
AST: 25 U/L (ref 15–41)
Alkaline Phosphatase: 61 U/L (ref 38–126)
Anion gap: 8 (ref 5–15)
BUN: 14 mg/dL (ref 6–20)
CHLORIDE: 109 mmol/L (ref 101–111)
CO2: 23 mmol/L (ref 22–32)
CREATININE: 1.16 mg/dL (ref 0.61–1.24)
Calcium: 9.4 mg/dL (ref 8.9–10.3)
GFR calc Af Amer: >60 mL/min (ref >60)
Glucose, Bld: 106 mg/dL — ABNORMAL HIGH (ref 65–99)
POTASSIUM: 4 mmol/L (ref 3.5–5.1)
SODIUM: 140 mmol/L (ref 135–145)
Total Bilirubin: 0.7 mg/dL (ref 0.3–1.2)
Total Protein: 7.5 g/dL (ref 6.5–8.1)

## 2016-12-23 MED ORDER — DEXTROSE 5 % IV SOLN
500.0000 mg | Freq: Once | INTRAVENOUS | Status: AC
  Administered 2016-12-23: 500 mg via INTRAVENOUS
  Filled 2016-12-23: qty 5

## 2016-12-23 MED ORDER — MAGNESIUM SULFATE 2 GM/50ML IV SOLN
2.0000 g | Freq: Once | INTRAVENOUS | Status: AC
  Administered 2016-12-23: 2 g via INTRAVENOUS
  Filled 2016-12-23: qty 50

## 2016-12-23 MED ORDER — TETANUS-DIPHTH-ACELL PERTUSSIS 5-2.5-18.5 LF-MCG/0.5 IM SUSP
0.5000 mL | Freq: Once | INTRAMUSCULAR | Status: AC
  Administered 2016-12-23: 0.5 mL via INTRAMUSCULAR
  Filled 2016-12-23: qty 0.5

## 2016-12-23 NOTE — ED Notes (Signed)
Pt placed on cardiac monitor and seizure precautions with pads on bed.

## 2016-12-23 NOTE — ED Triage Notes (Signed)
Pt here with seizure at around 3pm. Hx of same.  Last 1 1/2 years ago.  Not taking his meds x 4 days.  Fell from standing.  Body aches with ribs and rt elbow pain at highest.  Seizure x 2 min.  Witnessed.

## 2016-12-23 NOTE — ED Provider Notes (Signed)
[facility]-EMERGENCY DEPT Provider Note   CSN: [id number] Arrival date & time: 12/23/16  1536     History   Chief Complaint Chief Complaint  Patient presents with  . Seizures    HPI Erik Hoffman is a 36 y.o. male.  HPI   History of seizures, last one was 1 yr ago.  Has been off the depakote for about 3 days.  Seizure lasted about 2 minutes.  Fell and hit head, left ribs, elbow.  Fell hitting head on stainless steel table and fell onto tea-kettle he was carrying. Feels nausea and headache now 15/10 after falling and hitting head.  Felt like was going to have a seizure before it started, equilibrium, told coworker. Reports he just forgets his medication sometimes and did over the last 3 days.   No fevers, cough, numbness/weakness, vomiting.   Past Medical History:  Diagnosis Date  . Asthma   . Seizure (HCC)    hx of for years, license taken 2015    Patient Active Problem List   Diagnosis Date Noted  . Generalized idiopathic epilepsy and epileptic syndromes, without status epilepticus, not intractable (HCC) 02/12/2015    Past Surgical History:  Procedure Laterality Date  . URETHRA SURGERY  2012  . WISDOM TOOTH EXTRACTION         Home Medications    Prior to Admission medications   Medication Sig Start Date End Date Taking? Authorizing Provider  divalproex (DEPAKOTE) 500 MG DR tablet Take 1 tablet (500 mg total) by mouth 2 (two) times daily. 06/05/16  Yes Suanne Marker, MD  ibuprofen (ADVIL,MOTRIN) 200 MG tablet Take 400 mg by mouth every 6 (six) hours as needed for headache, mild pain or moderate pain.   Yes Historical Provider, MD    Family History Family History  Problem Relation Age of Onset  . Hypothyroidism Mother   . Cancer Mother     uterine  . Mesothelioma Father   . Depression Sister     Social History Social History  Substance Use Topics  . Smoking status: Current Every Day Smoker    Packs/day: 0.50    Years: 15.00    Types: Cigarettes    . Smokeless tobacco: Never Used     Comment: 03/04/16 < 1 PPD, 05/19/17 < 1/2 PPD  . Alcohol use 0.0 oz/week     Comment: Rare, "very moderate"     Allergies   Patient has no known allergies.   Review of Systems Review of Systems  Constitutional: Negative for fever.  HENT: Negative for sore throat.   Eyes: Negative for visual disturbance.  Respiratory: Negative for shortness of breath.   Cardiovascular: Positive for chest pain (chest wall).  Gastrointestinal: Positive for nausea. Negative for abdominal pain and vomiting.  Genitourinary: Negative for difficulty urinating.  Musculoskeletal: Positive for myalgias and neck pain. Negative for back pain and neck stiffness.  Skin: Negative for rash.  Neurological: Positive for seizures and headaches. Negative for syncope, facial asymmetry, weakness and numbness.     Physical Exam Updated Vital Signs BP 120/72   Pulse 75   Temp 98.2 F (36.8 C) (Oral)   Resp 17   SpO2 100%   Physical Exam  Constitutional: He is oriented to person, place, and time. He appears well-developed and well-nourished. No distress.  HENT:  Head: Normocephalic and atraumatic.  Mouth/Throat: No oropharyngeal exudate.  Eyes: Conjunctivae and EOM are normal. Pupils are equal, round, and reactive to light.  Neck: Normal range of motion.  Cardiovascular: Normal rate, regular rhythm, normal heart sounds and intact distal pulses.  Exam reveals no gallop and no friction rub.   No murmur heard. Pulmonary/Chest: Effort normal and breath sounds normal. No respiratory distress. He has no wheezes. He has no rales. He exhibits tenderness (left chest, mild erythema over area).  Abdominal: Soft. He exhibits no distension. There is no tenderness. There is no guarding.  Musculoskeletal: He exhibits no edema.       Cervical back: He exhibits bony tenderness.  Abrasion right elbow and tenderness, no effusion, swelling nor deformity.  Neurological: He is alert and oriented  to person, place, and time. He has normal strength. No cranial nerve deficit. Coordination normal. GCS eye subscore is 4. GCS verbal subscore is 5. GCS motor subscore is 6.  Skin: Skin is warm and dry. He is not diaphoretic.  Nursing note and vitals reviewed.    ED Treatments / Results  Labs (all labs ordered are listed, but only abnormal results are displayed) Labs Reviewed  CBC WITH DIFFERENTIAL/PLATELET - Abnormal; Notable for the following:       Result Value   WBC 16.6 (*)    Neutro Abs 14.3 (*)    All other components within normal limits  COMPREHENSIVE METABOLIC PANEL - Abnormal; Notable for the following:    Glucose, Bld 106 (*)    All other components within normal limits    EKG  EKG Interpretation  Date/Time:  Wednesday December 23 2016 17:38:29 EST Ventricular Rate:  69 PR Interval:    QRS Duration: 85 QT Interval:  391 QTC Calculation: 419 R Axis:   80 Text Interpretation:  Sinus rhythm ST elev, probable normal early repol pattern No significant change since last tracing Confirmed by [facility] MD, Denny Peon ([id number]) on 12/23/2016 6:08:16 PM Also confirmed by [facility] MD, Laurey Salser ([id number]), editor [place]  CCT, BEVERLY ([id number])  on 12/24/2016 7:45:52 AM       Radiology Dg Ribs Unilateral W/chest Left  Result Date: 12/23/2016 CLINICAL DATA:  Seizure with left rib pain abrasion upper back EXAM: LEFT RIBS AND CHEST - 3+ VIEW COMPARISON:  01/02/2004 FINDINGS: Single-view chest demonstrates no acute infiltrate or effusion. There is no pneumothorax. Left rib series demonstrates no acute displaced left rib fracture IMPRESSION: 1. No pneumothorax or pleural effusion 2. No definite acute displaced left rib fracture Electronically Signed   By: Jasmine Pang M.D.   On: 12/23/2016 19:13   Dg Elbow Complete Right  Result Date: 12/23/2016 CLINICAL DATA:  Seizure, elbow pain EXAM: RIGHT ELBOW - COMPLETE 3+ VIEW COMPARISON:  None. FINDINGS: There is no evidence of fracture, dislocation, or  joint effusion. There is no evidence of arthropathy or other focal bone abnormality. Soft tissues are unremarkable. IMPRESSION: Negative. Electronically Signed   By: Jasmine Pang M.D.   On: 12/23/2016 19:14   Ct Head Wo Contrast  Result Date: 12/23/2016 CLINICAL DATA:  Witnessed seizure at 3 p.m., body aches and RIGHT elbow pain. History of seizures. EXAM: CT HEAD WITHOUT CONTRAST CT CERVICAL SPINE WITHOUT CONTRAST TECHNIQUE: Multidetector CT imaging of the head and cervical spine was performed following the standard protocol without intravenous contrast. Multiplanar CT image reconstructions of the cervical spine were also generated. COMPARISON:  None.  CT HEAD December 29, 2013 FINDINGS: CT HEAD FINDINGS BRAIN: The ventricles and sulci are normal. No intraparenchymal hemorrhage, mass effect nor midline shift. No acute large vascular territory infarcts. No abnormal extra-axial fluid collections. Basal cisterns are patent. VASCULAR: Unremarkable. SKULL/SOFT TISSUES: No skull  fracture. No significant soft tissue swelling. ORBITS/SINUSES: The included ocular globes and orbital contents are normal.The mastoid aircells and included paranasal sinuses are well-aerated. OTHER: None. CT CERVICAL SPINE FINDINGS ALIGNMENT: Straightened lordosis. Vertebral bodies in alignment. SKULL BASE AND VERTEBRAE: Cervical vertebral bodies and posterior elements are intact. Intervertebral disc heights preserved. No destructive bony lesions. C1-2 articulation maintained. SOFT TISSUES AND SPINAL CANAL: Normal. DISC LEVELS: No significant osseous canal stenosis or neural foraminal narrowing. UPPER CHEST: Lung apices are clear. OTHER: None. IMPRESSION: Negative CT HEAD. Negative CT CERVICAL SPINE. Electronically Signed   By: Awilda Metroourtnay  Bloomer M.D.   On: 12/23/2016 18:08   Ct Cervical Spine Wo Contrast  Result Date: 12/23/2016 CLINICAL DATA:  Witnessed seizure at 3 p.m., body aches and RIGHT elbow pain. History of seizures. EXAM: CT HEAD  WITHOUT CONTRAST CT CERVICAL SPINE WITHOUT CONTRAST TECHNIQUE: Multidetector CT imaging of the head and cervical spine was performed following the standard protocol without intravenous contrast. Multiplanar CT image reconstructions of the cervical spine were also generated. COMPARISON:  None.  CT HEAD December 29, 2013 FINDINGS: CT HEAD FINDINGS BRAIN: The ventricles and sulci are normal. No intraparenchymal hemorrhage, mass effect nor midline shift. No acute large vascular territory infarcts. No abnormal extra-axial fluid collections. Basal cisterns are patent. VASCULAR: Unremarkable. SKULL/SOFT TISSUES: No skull fracture. No significant soft tissue swelling. ORBITS/SINUSES: The included ocular globes and orbital contents are normal.The mastoid aircells and included paranasal sinuses are well-aerated. OTHER: None. CT CERVICAL SPINE FINDINGS ALIGNMENT: Straightened lordosis. Vertebral bodies in alignment. SKULL BASE AND VERTEBRAE: Cervical vertebral bodies and posterior elements are intact. Intervertebral disc heights preserved. No destructive bony lesions. C1-2 articulation maintained. SOFT TISSUES AND SPINAL CANAL: Normal. DISC LEVELS: No significant osseous canal stenosis or neural foraminal narrowing. UPPER CHEST: Lung apices are clear. OTHER: None. IMPRESSION: Negative CT HEAD. Negative CT CERVICAL SPINE. Electronically Signed   By: Awilda Metroourtnay  Bloomer M.D.   On: 12/23/2016 18:08    Procedures Procedures (including critical care time)  Medications Ordered in ED Medications  valproate (DEPACON) 500 mg in dextrose 5 % 50 mL IVPB (0 mg Intravenous Stopped 12/23/16 2018)  magnesium sulfate IVPB 2 g 50 mL (0 g Intravenous Stopped 12/23/16 2018)  Tdap (BOOSTRIX) injection 0.5 mL (0.5 mLs Intramuscular Given 12/23/16 1805)     Initial Impression / Assessment and Plan / ED Course  I have reviewed the triage vital signs and the nursing notes.  Pertinent labs & imaging results that were available during my care of  the patient were reviewed by me and considered in my medical decision making (see chart for details).    36 year old male with a history of seizure disorder presents with concern for seizure and fall today. Patient had not been taking his medications over the last 3 days, and today had a seizure lasting approximately 2 minutes. Labs are obtained which show no significant abnormalities as other etiologies seizure. Suspect his seizure was secondary to nonadherence to medication regimen.  Gave him a dose of Depakote in the emergency department.  Recommend close follow-up with his neurologist, and strict medication adherence.  Regarding patient's fall, CT head and cervical spine were done which showed no acute abnormalities. X-ray of the right elbow shows no acute fractures. X-ray of the left ribs showed no acute abnormalities.  Suspect likely contusion versus less likely occult fx.  Patient discharged in stable condition with understanding of reasons to return.   Final Clinical Impressions(s) / ED Diagnoses   Final diagnoses:  Fall  Seizure (HCC)  Rib pain on left side    New Prescriptions Discharge Medication List as of 12/23/2016  7:39 PM       Alvira Monday, MD 12/24/16 1419

## 2017-01-11 ENCOUNTER — Ambulatory Visit: Payer: BLUE CROSS/BLUE SHIELD | Admitting: Diagnostic Neuroimaging

## 2017-06-16 ENCOUNTER — Other Ambulatory Visit: Payer: Self-pay | Admitting: Diagnostic Neuroimaging

## 2017-09-30 ENCOUNTER — Telehealth: Payer: Self-pay | Admitting: *Deleted

## 2017-09-30 NOTE — Telephone Encounter (Signed)
Called patient to  Discuss fax received stating he may not be taking seizure medication. Also need to schedule a follow up; patient last seen 06/05/16. Patient stated he is taking depakote "every now and then". He then stated he was at work and couldn't talk. He stated he would have to call back during his lunch time. He then ended the call. When patient calls back, phone staff may schedule a follow up with Dr Marjory LiesPenumalli or NP.

## 2017-10-21 ENCOUNTER — Encounter: Payer: Self-pay | Admitting: *Deleted

## 2017-10-21 NOTE — Telephone Encounter (Signed)
Mailed letter today advising patient to please call and schedule a follow up with Dr Marjory LiesPenumalli or one of the NP. Last seen Aug 2017.

## 2018-09-07 ENCOUNTER — Encounter (HOSPITAL_COMMUNITY): Payer: Self-pay

## 2018-09-07 ENCOUNTER — Emergency Department (HOSPITAL_COMMUNITY)
Admission: EM | Admit: 2018-09-07 | Discharge: 2018-09-07 | Disposition: A | Discharge status: Home / Self Care | Payer: BLUE CROSS/BLUE SHIELD | Attending: Emergency Medicine | Admitting: Emergency Medicine

## 2018-09-07 DIAGNOSIS — Z79899 Other long term (current) drug therapy: Secondary | ICD-10-CM | POA: Insufficient documentation

## 2018-09-07 DIAGNOSIS — F17210 Nicotine dependence, cigarettes, uncomplicated: Secondary | ICD-10-CM | POA: Diagnosis not present

## 2018-09-07 DIAGNOSIS — F1910 Other psychoactive substance abuse, uncomplicated: Secondary | ICD-10-CM

## 2018-09-07 DIAGNOSIS — J45909 Unspecified asthma, uncomplicated: Secondary | ICD-10-CM | POA: Insufficient documentation

## 2018-09-07 DIAGNOSIS — R569 Unspecified convulsions: Secondary | ICD-10-CM | POA: Insufficient documentation

## 2018-09-07 DIAGNOSIS — R4182 Altered mental status, unspecified: Secondary | ICD-10-CM | POA: Diagnosis present

## 2018-09-07 LAB — CBC WITH DIFFERENTIAL/PLATELET
ABS IMMATURE GRANULOCYTES: 0.05 10*3/uL (ref 0.00–0.07)
Basophils Absolute: 0.1 10*3/uL (ref 0.0–0.1)
Basophils Relative: 1 %
EOS PCT: 2 %
Eosinophils Absolute: 0.2 10*3/uL (ref 0.0–0.5)
HCT: 42.8 % (ref 39.0–52.0)
HEMOGLOBIN: 14.4 g/dL (ref 13.0–17.0)
Immature Granulocytes: 1 %
LYMPHS PCT: 17 %
Lymphs Abs: 1.8 10*3/uL (ref 0.7–4.0)
MCH: 30.6 pg (ref 26.0–34.0)
MCHC: 33.6 g/dL (ref 30.0–36.0)
MCV: 90.9 fL (ref 80.0–100.0)
MONO ABS: 0.9 10*3/uL (ref 0.1–1.0)
MONOS PCT: 8 %
NEUTROS ABS: 7.3 10*3/uL (ref 1.7–7.7)
Neutrophils Relative %: 71 %
PLATELETS: 325 10*3/uL (ref 150–400)
RBC: 4.71 MIL/uL (ref 4.22–5.81)
RDW: 12.2 % (ref 11.5–15.5)
WBC: 10.3 10*3/uL (ref 4.0–10.5)
nRBC: 0 % (ref 0.0–0.2)

## 2018-09-07 LAB — CBG MONITORING, ED: Glucose-Capillary: 94 mg/dL (ref 70–99)

## 2018-09-07 LAB — RAPID URINE DRUG SCREEN, HOSP PERFORMED
Amphetamines: POSITIVE — AB
Barbiturates: NOT DETECTED
Benzodiazepines: POSITIVE — AB
COCAINE: POSITIVE — AB
OPIATES: POSITIVE — AB
Tetrahydrocannabinol: POSITIVE — AB

## 2018-09-07 LAB — BASIC METABOLIC PANEL
Anion gap: 9 (ref 5–15)
BUN: 12 mg/dL (ref 6–20)
CHLORIDE: 105 mmol/L (ref 98–111)
CO2: 24 mmol/L (ref 22–32)
CREATININE: 1.1 mg/dL (ref 0.61–1.24)
Calcium: 9.2 mg/dL (ref 8.9–10.3)
GFR calc Af Amer: >60 mL/min (ref >60)
GFR calc non Af Amer: >60 mL/min (ref >60)
Glucose, Bld: 94 mg/dL (ref 70–99)
POTASSIUM: 3.8 mmol/L (ref 3.5–5.1)
Sodium: 138 mmol/L (ref 135–145)

## 2018-09-07 LAB — VALPROIC ACID LEVEL

## 2018-09-07 LAB — ETHANOL: Alcohol, Ethyl (B): <10 mg/dL (ref <10)

## 2018-09-07 MED ORDER — DIVALPROEX SODIUM 500 MG PO DR TAB: 500.0000 mg | DELAYED_RELEASE_TABLET | Freq: Two times a day (BID) | ORAL | 0 refills | Status: DC

## 2018-09-07 MED ORDER — SODIUM CHLORIDE 0.9 % IV BOLUS (SEPSIS)
1000.0000 mL | Freq: Once | INTRAVENOUS | Status: AC
Start: 2018-09-07 — End: 2018-09-07
  Administered 2018-09-07: 1000 mL via INTRAVENOUS

## 2018-09-07 NOTE — ED Notes (Signed)
Attempted to ambulate pt, pt had an unsteady gait. Pt about ran through the wall, this NT caught pt before hitting the wall. This NT stated it may be better if we sit back down and try again later. Notified Woody(RN)

## 2018-09-07 NOTE — ED Notes (Signed)
Ambulated pt in hallway, pt had steady gait. Pt stated still feels not like himself and uneven, Dr. Charline BillsStated when the drugs wear off pt will begin to feel better. Pt acknowledged and ambulated more in the hallway. Notified Woody(RN)

## 2018-09-07 NOTE — ED Notes (Addendum)
Pt CBG was 94, notified Andrea(RN)

## 2018-09-07 NOTE — ED Triage Notes (Signed)
Pt. Arrived by EMS from home. Pt. Had a witnessed seizure by friend. Pt. Has 3 seizures with EMS. Pt. Given 5 MG versed prior to arrival. Pt. Had done unknown amount of  Cocaine and xanax. Pt. arousable by painful stimuli.   CBG 104

## 2018-09-07 NOTE — Discharge Instructions (Addendum)
Please be aware you may have another seizure ° °Do not drive until seen by your physician for your condition ° °Do not climb ladders/roofs/trees as a seizure can occur at that height and cause serious harm ° °Do not bathe/swim alone as a seizure can occur and cause serious harm ° °Please followup with your physician or neurologist for further testing and possible treatment ° ° °

## 2018-09-07 NOTE — ED Provider Notes (Signed)
[facility] EMERGENCY DEPARTMENT Provider Note   CSN: [id number] Arrival date & time: 09/07/18  0133     History   Chief Complaint Level 5 Caveat due to altered mental status   HPI Erik Hoffman is a 37 y.o. male.  The history is provided by the EMS personnel. The history is limited by the condition of the patient.  Seizures   This is a new problem. Associated symptoms include confusion. There has been no fever.  pt with history of seizures presents for seizure activity.  Patient had a witnessed seizure by friend.  EMS reports they witnessed 3 seizures.  He was given Versed prior to arrival.  They report that he had used cocaine and Xanax.  Patient also smells of marijuana.  No other details known at this time  Past Medical History:  Diagnosis Date  . Asthma   . Seizure (HCC)    hx of for years, license taken 2015    Patient Active Problem List   Diagnosis Date Noted  . Generalized idiopathic epilepsy and epileptic syndromes, without status epilepticus, not intractable (HCC) 02/12/2015    Past Surgical History:  Procedure Laterality Date  . URETHRA SURGERY  2012  . WISDOM TOOTH EXTRACTION          Home Medications    Prior to Admission medications   Medication Sig Start Date End Date Taking? Authorizing Provider  divalproex (DEPAKOTE) 500 MG DR tablet TAKE 1 TABLET(500 MG) BY MOUTH TWICE DAILY 06/17/17   Penumalli, Glenford Bayley, MD  ibuprofen (ADVIL,MOTRIN) 200 MG tablet Take 400 mg by mouth every 6 (six) hours as needed for headache, mild pain or moderate pain.    [provider]    Family History Family History  Problem Relation Age of Onset  . Hypothyroidism Mother   . Cancer Mother        uterine  . Mesothelioma Father   . Depression Sister     Social History Social History   Tobacco Use  . Smoking status: Current Every Day Smoker    Packs/day: 0.50    Years: 15.00    Pack years: 7.50    Types: Cigarettes  .  Smokeless tobacco: Never Used  . Tobacco comment: 03/04/16 < 1 PPD, 05/19/17 < 1/2 PPD  Substance Use Topics  . Alcohol use: Yes    Alcohol/week: 0.0 standard drinks    Comment: Rare, "very moderate"  . Drug use: Yes    Types: Marijuana    Comment: daily use     Allergies   Patient has no known allergies.   Review of Systems Review of Systems  Unable to perform ROS: Mental status change  Neurological: Positive for seizures.  Psychiatric/Behavioral: Positive for confusion.     Physical Exam Updated Vital Signs BP 121/88   Pulse 97   Temp 98.1 F (36.7 C) (Oral)   Resp (!) 31   SpO2 98%   Physical Exam CONSTITUTIONAL: Disheveled, somnolent HEAD: Normocephalic/atraumatic, no visible trauma EYES: EOMI/PERRL ENMT: Mucous membranes moist NECK: supple no meningeal signs SPINE/BACK:entire spine nontender CV: S1/S2 noted, no murmurs/rubs/gallops noted LUNGS: Lungs are clear to auscultation bilaterally, no apparent distress ABDOMEN: soft, nontender NEURO: Pt is somnolent but arousable, appears confused, moves all extremities x4 EXTREMITIES: pulses normal/equal, full ROM SKIN: warm, color normal PSYCH: unable to assess  ED Treatments / Results  Labs (all labs ordered are listed, but only abnormal results are displayed) Labs Reviewed  RAPID URINE DRUG SCREEN, HOSP PERFORMED -  Abnormal; Notable for the following components:      Result Value   Opiates POSITIVE (*)    Cocaine POSITIVE (*)    Benzodiazepines POSITIVE (*)    Amphetamines POSITIVE (*)    Tetrahydrocannabinol POSITIVE (*)    All other components within normal limits  VALPROIC ACID LEVEL - Abnormal; Notable for the following components:   Valproic Acid Lvl <10 (*)    All other components within normal limits  BASIC METABOLIC PANEL  CBC WITH DIFFERENTIAL/PLATELET  ETHANOL  CBG MONITORING, ED    EKG EKG Interpretation  Date/Time:  Wednesday September 07 2018 01:39:48 EST Ventricular Rate:  102 PR  Interval:    QRS Duration: 107 QT Interval:  398 QTC Calculation: 519 R Axis:   90 Text Interpretation:  Sinus tachycardia Borderline right axis deviation Prolonged QT interval Confirmed by Zadie RhineWickline, Benancio Osmundson ([id number]) on 09/07/2018 1:57:49 AM   Radiology No results found.  Procedures Procedures   Medications Ordered in ED Medications  sodium chloride 0.9 % bolus 1,000 mL (0 mLs Intravenous Stopped 09/07/18 0647)     Initial Impression / Assessment and Plan / ED Course  I have reviewed the triage vital signs and the nursing notes.  Pertinent labs  results that were available during my care of the patient were reviewed by me and considered in my medical decision making (see chart for details).     2:00 AM Records indicate patient does have a history of seizures.  Vitals are appropriate.  He has been given Versed, therefore patient is asleep at this time.  Will follow closely 2:50 AM Mom is at bedside.  She reports the patient was experimenting with cocaine for the first time tonight.  She also reports that he use Xanax, and that will typically trigger seizure She reports he smokes marijuana daily for his seizures.  He does not take his Depakote Patient is somnolent but is arousable and more talkative.  We will continue to monitor 5:01 AM He is now more alert.  He is taking p.o. fluids.  He reports he has not had a seizure since 2018.  He reports he has not taken Depakote in a while.  I will send this to his pharmacy and refer to neurology. 7:23 AM Patient is improved, he is taking p.o. fluids.  He walked in the ER without difficulty.  No ataxia. Patient will restart his Depakote, referred to neurology. We discussed seizure precautions, particularly no driving until seen by neurology Final Clinical Impressions(s) / ED Diagnoses   Final diagnoses:  Seizure (HCC)  Substance abuse [facility](HCC)    ED Discharge Orders         Ordered    divalproex (DEPAKOTE) 500 MG DR tablet  2 times  daily     09/07/18 0500           Zadie RhineWickline, Verlie Hellenbrand, MD 09/07/18 [id number]

## 2019-07-22 ENCOUNTER — Encounter (HOSPITAL_COMMUNITY): Payer: Self-pay | Admitting: Emergency Medicine

## 2019-07-22 ENCOUNTER — Other Ambulatory Visit: Payer: Self-pay

## 2019-07-22 ENCOUNTER — Emergency Department (HOSPITAL_COMMUNITY)
Admission: EM | Admit: 2019-07-22 | Discharge: 2019-07-23 | Disposition: A | Discharge status: Home / Self Care | Payer: Self-pay | Attending: Emergency Medicine | Admitting: Emergency Medicine

## 2019-07-22 DIAGNOSIS — T507X1A Poisoning by analeptics and opioid receptor antagonists, accidental (unintentional), initial encounter: Secondary | ICD-10-CM | POA: Insufficient documentation

## 2019-07-22 DIAGNOSIS — T40601A Poisoning by unspecified narcotics, accidental (unintentional), initial encounter: Secondary | ICD-10-CM

## 2019-07-22 DIAGNOSIS — F17210 Nicotine dependence, cigarettes, uncomplicated: Secondary | ICD-10-CM | POA: Insufficient documentation

## 2019-07-22 DIAGNOSIS — R7401 Elevation of levels of liver transaminase levels: Secondary | ICD-10-CM

## 2019-07-22 DIAGNOSIS — J45909 Unspecified asthma, uncomplicated: Secondary | ICD-10-CM | POA: Insufficient documentation

## 2019-07-22 LAB — CBC WITH DIFFERENTIAL/PLATELET
Abs Immature Granulocytes: 0.05 10*3/uL (ref 0.00–0.07)
Basophils Absolute: 0.1 10*3/uL (ref 0.0–0.1)
Basophils Relative: 1 %
Eosinophils Absolute: 0.3 10*3/uL (ref 0.0–0.5)
Eosinophils Relative: 4 %
HCT: 39.4 % (ref 39.0–52.0)
Hemoglobin: 13.1 g/dL (ref 13.0–17.0)
Immature Granulocytes: 1 %
Lymphocytes Relative: 20 %
Lymphs Abs: 1.6 10*3/uL (ref 0.7–4.0)
MCH: 31.3 pg (ref 26.0–34.0)
MCHC: 33.2 g/dL (ref 30.0–36.0)
MCV: 94.3 fL (ref 80.0–100.0)
Monocytes Absolute: 0.6 10*3/uL (ref 0.1–1.0)
Monocytes Relative: 7 %
Neutro Abs: 5.3 10*3/uL (ref 1.7–7.7)
Neutrophils Relative %: 67 %
Platelets: 250 10*3/uL (ref 150–400)
RBC: 4.18 MIL/uL — ABNORMAL LOW (ref 4.22–5.81)
RDW: 12.1 % (ref 11.5–15.5)
WBC: 7.9 10*3/uL (ref 4.0–10.5)
nRBC: 0 % (ref 0.0–0.2)

## 2019-07-22 LAB — COMPREHENSIVE METABOLIC PANEL
ALT: 165 U/L — ABNORMAL HIGH (ref 0–44)
AST: 192 U/L — ABNORMAL HIGH (ref 15–41)
Albumin: 3.9 g/dL (ref 3.5–5.0)
Alkaline Phosphatase: 54 U/L (ref 38–126)
Anion gap: 10 (ref 5–15)
BUN: 18 mg/dL (ref 6–20)
CO2: 26 mmol/L (ref 22–32)
Calcium: 8.5 mg/dL — ABNORMAL LOW (ref 8.9–10.3)
Chloride: 103 mmol/L (ref 98–111)
Creatinine, Ser: 1.44 mg/dL — ABNORMAL HIGH (ref 0.61–1.24)
GFR calc Af Amer: >60 mL/min (ref >60)
GFR calc non Af Amer: >60 mL/min (ref >60)
Glucose, Bld: 175 mg/dL — ABNORMAL HIGH (ref 70–99)
Potassium: 3.6 mmol/L (ref 3.5–5.1)
Sodium: 139 mmol/L (ref 135–145)
Total Bilirubin: 0.3 mg/dL (ref 0.3–1.2)
Total Protein: 6.6 g/dL (ref 6.5–8.1)

## 2019-07-22 LAB — SEDIMENTATION RATE: Sed Rate: 6 mm/hr (ref 0–16)

## 2019-07-22 LAB — VALPROIC ACID LEVEL: Valproic Acid Lvl: <10 ug/mL — ABNORMAL LOW (ref 50.0–100.0)

## 2019-07-22 LAB — ETHANOL: Alcohol, Ethyl (B): <10 mg/dL (ref <10)

## 2019-07-22 LAB — ACETAMINOPHEN LEVEL: Acetaminophen (Tylenol), Serum: <10 ug/mL — ABNORMAL LOW (ref 10–30)

## 2019-07-22 NOTE — ED Provider Notes (Signed)
38 year old male received at sign out from Dr. Lita Mains pending metabolization and clinical improvement. Per his HPI:   "Patient states that he took 30 mg of Roxicodone and a Xanax bar around 3 PM today.  Denies that this was a suicide attempt.  States he takes Roxicodone roughly 2 or 3 times a month.  Denies any other coingestants.  Denies any alcohol use.  Patient became unresponsive while at a friend's house.  911 was called and [employer] found the patient apneic and cyanotic.  He was given 2 mg of Narcan with improved alertness.  Patient did have one episode of vomiting.  He is now becoming more drowsy.  Denies any other complaints."  Physical Exam  BP 108/76   Pulse 78   Temp 98 F (36.7 C) (Oral)   Resp 10   Ht 5\' 9"  (1.753 m)   Wt 72.6 kg   SpO2 97%   BMI 23.63 kg/m   Physical Exam Vitals signs and nursing note reviewed.  Constitutional:      Appearance: He is well-developed.  HENT:     Head: Normocephalic.  Eyes:     Conjunctiva/sclera: Conjunctivae normal.  Neck:     Musculoskeletal: Neck supple.  Cardiovascular:     Rate and Rhythm: Normal rate and regular rhythm.     Heart sounds: No murmur.  Pulmonary:     Effort: Pulmonary effort is normal.  Abdominal:     General: There is no distension.     Palpations: Abdomen is soft.     Tenderness: There is no abdominal tenderness.     Comments: Abdomen is soft and nontender.  Negative Murphy sign.  Skin:    General: Skin is warm and dry.  Neurological:     Mental Status: He is alert.  Psychiatric:        Behavior: Behavior normal.     ED Course/Procedures   Clinical Course as of Jul 22 516  Sun Jul 23, 2019  0227 Patient is now awake and alert.  He is able to provide a history.  He states that he took 30 mg of Roxicodone earlier tonight for dental pain.  The medication was not his prescription.  He does not have a prescription for oxycodone.  He has a follow-up with a dentist for known pain.   [MM]    Clinical Course  User Index [MM] Aksel Bencomo A, PA-C    Procedures  MDM   38 year old male received at signout from Dr. Lita Mains pending clinical improvement and metabolization of drugs after an unintentional drug overdose.  He was given Narcan prior to arrival.  He has not required additional Narcan since arrival in the ER.  Please see Dr. Edwena Felty note for further work-up and medical decision making.  The patient remains hemodynamically stable and later was alert, ambulatory without difficulty, and able to provide further history.  Apparently, he took 30 mg of oxycodone earlier tonight for dental pain.  He is established with a dentist and is being followed for his tooth.  States he does not typically take pain medication.  The prescription was not his.  Will discharge with a prescription for Narcan.  Of note, labs with acutely elevated AST and ALT.  On my examination, he has no abdominal tenderness.  Doubt cholecystitis.  I discussed with the patient that he should follow-up to have repeat labs in the outpatient setting to follow his elevated transaminases.  He does not currently have a PCP, but referral resources were included  on his discharge paperwork.  He has a mild AKI, but this was treated with IV fluids.  UDS was positive for THC, opioids, benzodiazepines.   Prior to discharge, the patient was hemodynamically stable and in no acute distress.  All questions were answered.  He was advised to discontinue opioid use and to follow-up with his dentist regarding his dental pain.  ER return precautions given.  Safe for discharge home at this time.         Barkley Boards, PA-C 07/23/19 0517    Loren Racer, MD 07/26/19 2118

## 2019-07-22 NOTE — ED Triage Notes (Signed)
Patient arrived by EMS from home. Pt took "pills" that EMS is unaware of drug content. Patient has history of taking narcotics.   Pt was given Narcan 4mg  by EMS and [employer].   Patient is Alert and Oriented x 4 w/ lethargy.   EMS VS BP 130/92 HR 104 RR 24 SpO2 99%.

## 2019-07-22 NOTE — Discharge Instructions (Addendum)
Thank you for allowing me to care for you today in the Emergency Department.   If you continue to have pain related to your teeth, you can take 600 mg of ibuprofen every 6 hours as needed with food.  Your liver enzymes were elevated today.  This appears to be new from previous blood work.  I would avoid Tylenol until you have your blood work and your liver enzymes rechecked.  Call the number on your discharge paperwork to get established with a primary care provider to have this blood work redrawn in the next few weeks.  Avoid taking medications that you do not have prescriptions for.  You have been given a prescription for Narcan if you have difficulty breathing or stop breathing from taking too much pain medication.  Return to the emergency department if you develop severe upper abdominal pain, persistent vomiting, high fevers, or other new, concerning symptoms.

## 2019-07-22 NOTE — ED Provider Notes (Addendum)
[facility]-EMERGENCY DEPT Provider Note   CSN: [id number] Arrival date & time: 07/22/19  [id number]     History   Chief Complaint Chief Complaint  Patient presents with  . Drug Overdose    HPI Len Blalockicholas A Laflam is a 38 y.o. male.     HPI Patient states that he took 30 mg of Roxicodone and a Xanax bar around 3 PM today.  Denies that this was a suicide attempt.  States he takes Roxicodone roughly 2 or 3 times a month.  Denies any other coingestants.  Denies any alcohol use.  Patient became unresponsive while at a friend's house.  911 was called and [employer] found the patient apneic and cyanotic.  He was given 2 mg of Narcan with improved alertness.  Patient did have one episode of vomiting.  He is now becoming more drowsy.  Denies any other complaints. Past Medical History:  Diagnosis Date  . Asthma   . Seizure (HCC)    hx of for years, license taken 2015    Patient Active Problem List   Diagnosis Date Noted  . Generalized idiopathic epilepsy and epileptic syndromes, without status epilepticus, not intractable (HCC) 02/12/2015    Past Surgical History:  Procedure Laterality Date  . URETHRA SURGERY  2012  . WISDOM TOOTH EXTRACTION          Home Medications    Prior to Admission medications   Medication Sig Start Date End Date Taking? Authorizing Provider  divalproex (DEPAKOTE) 500 MG DR tablet Take 1 tablet (500 mg total) by mouth 2 (two) times daily. 09/07/18   Zadie RhineWickline, Donald, MD    Family History Family History  Problem Relation Age of Onset  . Hypothyroidism Mother   . Cancer Mother        uterine  . Mesothelioma Father   . Depression Sister     Social History Social History   Tobacco Use  . Smoking status: Current Every Day Smoker    Packs/day: 0.50    Years: 15.00    Pack years: 7.50    Types: Cigarettes  . Smokeless tobacco: Never Used  . Tobacco comment: 03/04/16 < 1 PPD, 05/19/17 < 1/2 PPD  Substance Use Topics  . Alcohol use:  Yes    Alcohol/week: 0.0 standard drinks    Comment: Rare, "very moderate"  . Drug use: Yes    Types: Marijuana    Comment: daily use     Allergies   Patient has no known allergies.   Review of Systems Review of Systems  HENT: Negative for facial swelling.   Respiratory: Negative for shortness of breath.   Cardiovascular: Negative for chest pain.  Gastrointestinal: Negative for abdominal pain.  Musculoskeletal: Negative for back pain.  Skin: Negative for rash and wound.  Neurological: Negative for headaches.  Psychiatric/Behavioral: Negative for suicidal ideas.  All other systems reviewed and are negative.    Physical Exam Updated Vital Signs BP 104/65   Pulse 90   Temp 98 F (36.7 C) (Oral)   Resp 15   Ht 5\' 9"  (1.753 m)   Wt 72.6 kg   SpO2 95%   BMI 23.63 kg/m   Physical Exam Vitals signs and nursing note reviewed.  Constitutional:      Appearance: Normal appearance. He is well-developed.     Comments: Patient is drowsy but arousable to voice.  HENT:     Head: Normocephalic and atraumatic.     Nose: Nose normal.     Mouth/Throat:  Mouth: Mucous membranes are moist.  Eyes:     Comments: Pinpoint pupils bilaterally.  Neck:     Musculoskeletal: Normal range of motion and neck supple. No neck rigidity or muscular tenderness.  Cardiovascular:     Rate and Rhythm: Normal rate and regular rhythm.     Heart sounds: No murmur. No friction rub. No gallop.   Pulmonary:     Effort: Pulmonary effort is normal. No respiratory distress.     Breath sounds: Normal breath sounds. No stridor. No wheezing, rhonchi or rales.  Abdominal:     General: Bowel sounds are normal. There is no distension.     Palpations: Abdomen is soft.     Tenderness: There is no abdominal tenderness. There is no guarding or rebound.  Musculoskeletal: Normal range of motion.        General: No swelling, tenderness, deformity or signs of injury.     Right lower leg: No edema.     Left  lower leg: No edema.  Lymphadenopathy:     Cervical: No cervical adenopathy.  Skin:    General: Skin is warm and dry.     Capillary Refill: Capillary refill takes less than 2 seconds.     Findings: No erythema or rash.  Neurological:     General: No focal deficit present.     Mental Status: He is oriented to person, place, and time.  Psychiatric:     Comments: Denies SI      ED Treatments / Results  Labs (all labs ordered are listed, but only abnormal results are displayed) Labs Reviewed  CBC WITH DIFFERENTIAL/PLATELET - Abnormal; Notable for the following components:      Result Value   RBC 4.18 (*)    All other components within normal limits  COMPREHENSIVE METABOLIC PANEL - Abnormal; Notable for the following components:   Glucose, Bld 175 (*)    Creatinine, Ser 1.44 (*)    Calcium 8.5 (*)    AST 192 (*)    ALT 165 (*)    All other components within normal limits  ACETAMINOPHEN LEVEL - Abnormal; Notable for the following components:   Acetaminophen (Tylenol), Serum <10 (*)    All other components within normal limits  ETHANOL  SEDIMENTATION RATE  RAPID URINE DRUG SCREEN, HOSP PERFORMED  VALPROIC ACID LEVEL    EKG None  Radiology No results found.  Procedures Procedures (including critical care time)  Medications Ordered in ED Medications - No data to display   Initial Impression / Assessment and Plan / ED Course  I have reviewed the triage vital signs and the nursing notes.  Pertinent labs & imaging results that were available during my care of the patient were reviewed by me and considered in my medical decision making (see chart for details).  Clinical Course as of Aug 03 1546  Sun Jul 23, 2019  0227 Patient is now awake and alert.  He is able to provide a history.  He states that he took 30 mg of Roxicodone earlier tonight for dental pain.  The medication was not his prescription.  He does not have a prescription for oxycodone.  He has a follow-up  with a dentist for known pain.   [MM]    Clinical Course User Index [MM] McDonald, Mia A, PA-C       Patient continues to be drowsy but arousable.  Denies SI.  Will need to be observed until sober.  Signed out to oncoming emergency provider.  Final  Clinical Impressions(s) / ED Diagnoses   Final diagnoses:  Opiate overdose, accidental or unintentional, initial encounter [facility])    ED Discharge Orders    None       Loren Racer, MD 07/22/19 2153    Loren Racer, MD 08/04/19 1547

## 2019-07-23 LAB — RAPID URINE DRUG SCREEN, HOSP PERFORMED
Amphetamines: NOT DETECTED
Barbiturates: NOT DETECTED
Benzodiazepines: POSITIVE — AB
Cocaine: NOT DETECTED
Opiates: POSITIVE — AB
Tetrahydrocannabinol: POSITIVE — AB

## 2019-07-23 MED ORDER — NALOXONE HCL 4 MG/0.1ML NA LIQD: 1.0000 | Freq: Once | NASAL | 0 refills | Status: AC

## 2019-07-23 NOTE — ED Notes (Signed)
Pt is responsive to voice. Patient aware that we need urine sample for testing, unable at this time. Pt given instruction on providing urine sample when able to do so.

## 2019-07-23 NOTE — ED Notes (Signed)
Pt ambulated in hallway with steady gait, given meal and drink.

## 2019-12-15 ENCOUNTER — Other Ambulatory Visit: Payer: Self-pay

## 2019-12-15 ENCOUNTER — Emergency Department (HOSPITAL_COMMUNITY)
Admission: EM | Admit: 2019-12-15 | Discharge: 2019-12-15 | Disposition: A | Discharge status: Home / Self Care | Payer: Self-pay | Attending: Emergency Medicine | Admitting: Emergency Medicine

## 2019-12-15 ENCOUNTER — Emergency Department (HOSPITAL_COMMUNITY): Payer: Self-pay

## 2019-12-15 ENCOUNTER — Encounter (HOSPITAL_COMMUNITY): Payer: Self-pay | Admitting: *Deleted

## 2019-12-15 DIAGNOSIS — W010XXA Fall on same level from slipping, tripping and stumbling without subsequent striking against object, initial encounter: Secondary | ICD-10-CM | POA: Insufficient documentation

## 2019-12-15 DIAGNOSIS — Y929 Unspecified place or not applicable: Secondary | ICD-10-CM | POA: Insufficient documentation

## 2019-12-15 DIAGNOSIS — Y939 Activity, unspecified: Secondary | ICD-10-CM | POA: Insufficient documentation

## 2019-12-15 DIAGNOSIS — M25512 Pain in left shoulder: Secondary | ICD-10-CM | POA: Insufficient documentation

## 2019-12-15 DIAGNOSIS — J45909 Unspecified asthma, uncomplicated: Secondary | ICD-10-CM | POA: Insufficient documentation

## 2019-12-15 DIAGNOSIS — F17210 Nicotine dependence, cigarettes, uncomplicated: Secondary | ICD-10-CM | POA: Insufficient documentation

## 2019-12-15 DIAGNOSIS — Y999 Unspecified external cause status: Secondary | ICD-10-CM | POA: Insufficient documentation

## 2019-12-15 NOTE — Discharge Instructions (Addendum)
1. Medications: Alternate 600 mg of ibuprofen and [phone] mg of Tylenol every 3 hours as needed for pain. Do not exceed 4000 mg of Tylenol daily.  Take ibuprofen with food to avoid upset stomach issues.  2. Treatment: rest, wear the sling as needed for comfort.  Do the attached exercises or YouTube search rotator cuff physical therapy exercises and doing exercise at least 3-4 times a week.  Apply ice or heat, whichever feels best, 20 minutes at a time as needed for pain.  Avoid heavy lifting greater than 10 pounds. 3. Follow Up: Please followup with orthopedics as directed or your PCP in 1 week if no improvement for discussion of your diagnoses and further evaluation after today's visit; Please return to the ER for worsening symptoms or other concerns such as worsening swelling, redness of the skin, fevers, loss of pulses, or loss of feeling

## 2019-12-15 NOTE — ED Triage Notes (Addendum)
Larey Seat about 5 pm Sunday, Woke up Monday morning and could not move left arm, ? shoulder related

## 2019-12-15 NOTE — ED Notes (Signed)
Sling applied to left arm.

## 2019-12-15 NOTE — ED Provider Notes (Signed)
[facility]-EMERGENCY DEPT Provider Note   CSN: [id number] Arrival date & time: 12/15/19  1152     History Chief Complaint  Patient presents with  . Shoulder Injury    Erik Hoffman is a 39 y.o. male with history of seizure disorder currently unmedicated, asthma presents for evaluation of acute onset, persistent left upper extremity weakness and left shoulder pain after injury 5 days ago.  He reports that on Sunday he slipped on water under his fridge and landed on his left shoulder.  Denies head injury or loss of consciousness.  He does not think that he had a seizure as he remembers the fall and had no urinary incontinence.  He is not currently taking any medications for his seizures but states that he uses THC and CBD which keeps him from having seizures and states he has not had one in a very long time.  He notes initially having mild soreness to the anterior aspect of the left shoulder and went to sleep on Sunday but woke on Monday to find numbness/pins and needle sensation to the left upper extremity.  He notes significant difficulty with flexion and abduction of the shoulder, has a very difficult time carrying anything with weight to it in the left hand.  Notes soreness mostly to the anterior aspect of the left shoulder which is brought on by certain movements.  He notes intermittent pins-and-needles sensation mostly from the elbow down into the thumb.  He has been applying ice and heat and attempting gentle stretching without relief of symptoms so he came in today for further evaluation.  He has not tried anything else for his symptoms.  The history is provided by the patient.       Past Medical History:  Diagnosis Date  . Asthma   . Seizure (HCC)    hx of for years, license taken 2015    Patient Active Problem List   Diagnosis Date Noted  . Generalized idiopathic epilepsy and epileptic syndromes, without status epilepticus, not intractable (HCC)  02/12/2015    Past Surgical History:  Procedure Laterality Date  . URETHRA SURGERY  2012  . WISDOM TOOTH EXTRACTION         Family History  Problem Relation Age of Onset  . Hypothyroidism Mother   . Cancer Mother        uterine  . Mesothelioma Father   . Depression Sister     Social History   Tobacco Use  . Smoking status: Current Some Day Smoker    Packs/day: 0.50    Years: 15.00    Pack years: 7.50    Types: Cigarettes  . Smokeless tobacco: Never Used  . Tobacco comment: 03/04/16 < 1 PPD, 05/19/17 < 1/2 PPD  Substance Use Topics  . Alcohol use: Yes    Alcohol/week: 0.0 standard drinks    Comment: Rare, "very moderate"  . Drug use: Not Currently    Types: Marijuana    Comment: daily use    Home Medications Prior to Admission medications   Medication Sig Start Date End Date Taking? Authorizing Provider  acetaminophen (TYLENOL) 500 MG tablet Take 500 mg by mouth every 6 (six) hours as needed for moderate pain.   Yes [provider]  ibuprofen (ADVIL) 200 MG tablet Take 600 mg by mouth every 6 (six) hours as needed for moderate pain.   Yes [provider]  divalproex (DEPAKOTE) 500 MG DR tablet Take 1 tablet (500 mg total) by mouth 2 (  two) times daily. Patient not taking: Reported on 07/22/2019 09/07/18 07/23/19  Ripley Fraise, MD    Allergies    Patient has no known allergies.  Review of Systems   Review of Systems  Musculoskeletal: Positive for arthralgias. Negative for neck pain.  Neurological: Positive for weakness and numbness. Negative for syncope.    Physical Exam Updated Vital Signs BP 137/83 (BP Location: Right Arm)   Pulse 79   Temp 98.3 F (36.8 C) (Oral)   Resp 14   Ht 5\' 8"  (1.727 m)   Wt 72.6 kg   SpO2 100%   BMI 24.33 kg/m   Physical Exam Vitals and nursing note reviewed.  Constitutional:      General: He is not in acute distress.    Appearance: He is well-developed.     Comments: Resting comfortably in chair    HENT:     Head: Normocephalic and atraumatic.  Eyes:     General:        Right eye: No discharge.        Left eye: No discharge.     Conjunctiva/sclera: Conjunctivae normal.  Neck:     Vascular: No JVD.     Trachea: No tracheal deviation.  Cardiovascular:     Rate and Rhythm: Normal rate.     Pulses: Normal pulses.     Comments: 2+ radial pulses bilaterally Pulmonary:     Effort: Pulmonary effort is normal.  Abdominal:     General: There is no distension.  Musculoskeletal:        General: Tenderness present.     Cervical back: Normal range of motion and neck supple. No rigidity or tenderness.     Comments: Tenderness to palpation at the left acromioclavicular joint and left bicipital tendon groove.  No midline cervical spine tenderness or left paracervical muscle tenderness. When distracted, he is able to flex the left elbow and actively move from the elbow down without difficulty. When not distracted, he seemingly protects the extremity and uses his right hand to passively move the LUE.  Limited range of motion with abduction, flexion, and internal rotation of the left shoulder actively.  Otherwise 5/5 strength of bilateral upper extremity major muscle groups.  Lymphadenopathy:     Cervical: No cervical adenopathy.  Skin:    General: Skin is warm and dry.     Findings: No erythema.  Neurological:     Mental Status: He is alert.     Comments: Fluent speech, no facial droop.  Good grip strength bilaterally.  Altered sensation to light touch of the radial aspect of the left upper extremity extending into the left thumb but otherwise normal sensation to light touch of the bilateral upper extremities.  Psychiatric:        Behavior: Behavior normal.     ED Results / Procedures / Treatments   Labs (all labs ordered are listed, but only abnormal results are displayed) Labs Reviewed - No data to display  EKG None  Radiology DG Shoulder Left  Result Date: 12/15/2019 CLINICAL  DATA:  Shoulder pain with limited range of motion since falling 5 days ago. EXAM: LEFT SHOULDER - 2+ VIEW COMPARISON:  None. FINDINGS: The mineralization and alignment are normal. There is no evidence of acute fracture or dislocation. The subacromial space is preserved. Possible small osteophytes along the inferior glenoid rim. The acromioclavicular joint appears unremarkable. IMPRESSION: No acute osseous findings. Possible small osteophytes along the inferior glenoid rim. Electronically Signed   By: Gwyndolyn Saxon  Purcell Mouton M.D.   On: 12/15/2019 12:46    Procedures Procedures (including critical care time)  Medications Ordered in ED Medications - No data to display  ED Course  I have reviewed the triage vital signs and the nursing notes.  Pertinent labs & imaging results that were available during my care of the patient were reviewed by me and considered in my medical decision making (see chart for details).    MDM Rules/Calculators/A&P                      Patient presenting for evaluation of left shoulder pain after injury 5 days ago.  He is afebrile, vital signs are stable.  He is nontoxic in appearance.  Reports subjectively altered sensation along the radial aspect of the right forearm extending into the thumb but does not necessarily follow a particular distribution.  His examination is somewhat inconsistent but suggestive of rotator cuff pathology.  Radiographs showed no evidence of acute osseous abnormality including fracture or dislocation.  No concern for septic arthritis, osteomyelitis, or DVT.  He does have some possible small osteophytes along the inferior glenoid rim.  On reevaluation patient is resting comfortably in no apparent distress.  He was placed in a shoulder sling.  Conservative therapy indicated and discussed with patient.  His insurance kicks in on Monday; he reports he will be able to follow-up with orthopedics after that.  Discussed strict ED return precautions.  Discussed with  Dr. Estell Harpin who agrees with assessment and plan at this time. Final Clinical Impression(s) / ED Diagnoses Final diagnoses:  Acute pain of left shoulder    Rx / DC Orders ED Discharge Orders    None       Bennye Alm 12/15/19 1355    Bethann Berkshire, MD 12/16/19 [phone]

## 2020-01-11 ENCOUNTER — Other Ambulatory Visit: Payer: Self-pay

## 2020-01-11 DIAGNOSIS — G5602 Carpal tunnel syndrome, left upper limb: Secondary | ICD-10-CM

## 2020-02-06 ENCOUNTER — Other Ambulatory Visit: Payer: Self-pay

## 2020-02-06 ENCOUNTER — Ambulatory Visit (INDEPENDENT_AMBULATORY_CARE_PROVIDER_SITE_OTHER): Payer: 59 | Admitting: Neurology

## 2020-02-06 DIAGNOSIS — M5412 Radiculopathy, cervical region: Secondary | ICD-10-CM

## 2020-02-06 DIAGNOSIS — G5602 Carpal tunnel syndrome, left upper limb: Secondary | ICD-10-CM

## 2020-02-06 NOTE — Procedures (Signed)
[facility] Neurology  [address] [place], [address]  [place], [place] [address] Tel: [phone] Fax:  [phone] Test Date:  02/06/2020  Patient: Erik Hoffman DOB: 12/27/1980 Physician: Nita Sickle, DO  Sex: Male Height: 5\' 9"  Ref Phys: , MD  ID#: [id number] Pippin Temp: 34.0C Technician:    Patient Complaints: This is a 39 year old man referred for evaluation of left arm weakness and pain.  NCV & EMG Findings: Extensive electrodiagnostic testing of the left upper extremity shows:  1. Left median, ulnar, mixed palmar, and medial and lateral antebrachial cutaneous sensory responses are within normal limits. 2. Left median and ulnar motor responses are within normal limits. 3. Chronic motor axonal loss changes are seen affecting the pronator teres, biceps, deltoid, and infraspinatus muscles with active fibrillation potentials.    Impression: 1. 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 2. There is no definite evidence of a left brachial plexopathy, recommend clinical correlation.   ___________________________ 20, DO    Nerve Conduction Studies Anti Sensory Summary Table   Stim Site NR Peak (ms) Norm Peak (ms) P-T Amp (V) Norm P-T Amp  Left Lat Ante Brach Cutan Anti Sensory (Lat Forearm)  34C  Lat Biceps    2.0 <2.9 15.8 >14  Left Med Ante Brach Cutan Anti Sensory (Med Forearm)  34C  Elbow    2.3  8.6   Left Median Anti Sensory (2nd Digit)  34C  Wrist    2.9 <3.4 35.8 >20  Left Ulnar Anti Sensory (5th Digit)  34C  Wrist    2.7 <3.1 27.5 >12   Motor Summary Table   Stim Site NR Onset (ms) Norm Onset (ms) O-P Amp (mV) Norm O-P Amp Site1 Site2 Delta-0 (ms) Dist (cm) Vel (m/s) Norm Vel (m/s)  Left Median Motor (Abd Poll Brev)  34C  Wrist    2.9 <3.9 8.8 >6 Elbow Wrist 4.9 32.0 65 >50  Elbow    7.8  8.6         Left Ulnar Motor (Abd Dig Minimi)  34C  Wrist    2.5 <3.1 9.6 >7 B Elbow Wrist 3.8 24.0 63 >50    B Elbow    6.3  9.4  A Elbow B Elbow 1.7 10.0 59 >50  A Elbow    8.0  9.0          Comparison Summary Table   Stim Site NR Peak (ms) Norm Peak (ms) P-T Amp (V) Site1 Site2 Delta-P (ms) Norm Delta (ms)  Left Median/Ulnar Palm Comparison (Wrist - 8cm)  34C  Median Palm    1.6 <2.2 46.4 Median Palm Ulnar Palm 0.1   Ulnar Palm    1.7 <2.2 19.4       EMG   Side Muscle Ins Act Fibs Psw Fasc Number Recrt Dur Dur. Amp Amp. Poly Poly. Comment  Left 1stDorInt Nml Nml Nml Nml Nml Nml Nml Nml Nml Nml Nml Nml N/A  Left PronatorTeres Nml 1+ Nml Nml Nml Rapid Some 1+ Some 1+ Nml Nml N/A  Left Biceps Nml 1+ Nml Nml Nml Rapid Many 1+ Many 1+ Nml Nml N/A  Left Triceps Nml Nml Nml Nml Nml Nml Nml Nml Nml Nml Nml Nml N/A  Left Deltoid Nml 1+ Nml Nml Nml Rapid Many 1+ Many 1+ Nml Nml N/A  Left Cervical Parasp Low Nml Nml Nml Nml Nml - - - - - - - N/A  Left Infraspinatus Nml 1+ Nml Nml Nml Rapid  Many 1+ Many 1+ Nml Nml N/A      Waveforms:

## 2020-10-26 ENCOUNTER — Other Ambulatory Visit: Payer: 59

## 2020-10-26 DIAGNOSIS — Z20822 Contact with and (suspected) exposure to covid-19: Secondary | ICD-10-CM

## 2020-10-29 LAB — NOVEL CORONAVIRUS, NAA: SARS-CoV-2, NAA: NOT DETECTED

## 2021-09-16 ENCOUNTER — Emergency Department (HOSPITAL_COMMUNITY): Payer: 59

## 2021-09-16 ENCOUNTER — Inpatient Hospital Stay (HOSPITAL_COMMUNITY)
Admission: EM | Admit: 2021-09-16 | Discharge: 2021-09-18 | DRG: 100 | Disposition: A | Discharge status: Home / Self Care | Payer: 59 | Attending: Emergency Medicine | Admitting: Emergency Medicine

## 2021-09-16 ENCOUNTER — Other Ambulatory Visit: Payer: Self-pay

## 2021-09-16 ENCOUNTER — Inpatient Hospital Stay (HOSPITAL_COMMUNITY): Payer: 59

## 2021-09-16 ENCOUNTER — Encounter (HOSPITAL_COMMUNITY): Payer: Self-pay

## 2021-09-16 DIAGNOSIS — Z808 Family history of malignant neoplasm of other organs or systems: Secondary | ICD-10-CM

## 2021-09-16 DIAGNOSIS — J9690 Respiratory failure, unspecified, unspecified whether with hypoxia or hypercapnia: Secondary | ICD-10-CM

## 2021-09-16 DIAGNOSIS — E8721 Acute metabolic acidosis: Secondary | ICD-10-CM | POA: Diagnosis not present

## 2021-09-16 DIAGNOSIS — G40409 Other generalized epilepsy and epileptic syndromes, not intractable, without status epilepticus: Principal | ICD-10-CM | POA: Diagnosis present

## 2021-09-16 DIAGNOSIS — F17210 Nicotine dependence, cigarettes, uncomplicated: Secondary | ICD-10-CM | POA: Diagnosis present

## 2021-09-16 DIAGNOSIS — Z79899 Other long term (current) drug therapy: Secondary | ICD-10-CM

## 2021-09-16 DIAGNOSIS — F1410 Cocaine abuse, uncomplicated: Secondary | ICD-10-CM | POA: Diagnosis present

## 2021-09-16 DIAGNOSIS — Z91199 Patient's noncompliance with other medical treatment and regimen due to unspecified reason: Secondary | ICD-10-CM

## 2021-09-16 DIAGNOSIS — D72829 Elevated white blood cell count, unspecified: Secondary | ICD-10-CM | POA: Diagnosis present

## 2021-09-16 DIAGNOSIS — E162 Hypoglycemia, unspecified: Secondary | ICD-10-CM | POA: Diagnosis present

## 2021-09-16 DIAGNOSIS — J9602 Acute respiratory failure with hypercapnia: Secondary | ICD-10-CM | POA: Diagnosis present

## 2021-09-16 DIAGNOSIS — R569 Unspecified convulsions: Secondary | ICD-10-CM | POA: Diagnosis not present

## 2021-09-16 DIAGNOSIS — F1990 Other psychoactive substance use, unspecified, uncomplicated: Secondary | ICD-10-CM

## 2021-09-16 DIAGNOSIS — Z818 Family history of other mental and behavioral disorders: Secondary | ICD-10-CM | POA: Diagnosis not present

## 2021-09-16 DIAGNOSIS — S80212A Abrasion, left knee, initial encounter: Secondary | ICD-10-CM | POA: Diagnosis present

## 2021-09-16 DIAGNOSIS — E871 Hypo-osmolality and hyponatremia: Secondary | ICD-10-CM | POA: Diagnosis not present

## 2021-09-16 DIAGNOSIS — E876 Hypokalemia: Secondary | ICD-10-CM | POA: Diagnosis not present

## 2021-09-16 DIAGNOSIS — Y92010 Kitchen of single-family (private) house as the place of occurrence of the external cause: Secondary | ICD-10-CM | POA: Diagnosis not present

## 2021-09-16 DIAGNOSIS — J9601 Acute respiratory failure with hypoxia: Secondary | ICD-10-CM | POA: Diagnosis present

## 2021-09-16 DIAGNOSIS — Z820 Family history of epilepsy and other diseases of the nervous system: Secondary | ICD-10-CM

## 2021-09-16 DIAGNOSIS — Z20822 Contact with and (suspected) exposure to covid-19: Secondary | ICD-10-CM | POA: Diagnosis present

## 2021-09-16 DIAGNOSIS — N179 Acute kidney failure, unspecified: Secondary | ICD-10-CM | POA: Diagnosis not present

## 2021-09-16 DIAGNOSIS — T1490XA Injury, unspecified, initial encounter: Secondary | ICD-10-CM

## 2021-09-16 DIAGNOSIS — M6282 Rhabdomyolysis: Secondary | ICD-10-CM | POA: Diagnosis present

## 2021-09-16 DIAGNOSIS — R739 Hyperglycemia, unspecified: Secondary | ICD-10-CM | POA: Diagnosis present

## 2021-09-16 DIAGNOSIS — W1830XA Fall on same level, unspecified, initial encounter: Secondary | ICD-10-CM | POA: Diagnosis present

## 2021-09-16 DIAGNOSIS — E872 Acidosis, unspecified: Secondary | ICD-10-CM | POA: Diagnosis present

## 2021-09-16 DIAGNOSIS — S0003XA Contusion of scalp, initial encounter: Secondary | ICD-10-CM | POA: Diagnosis present

## 2021-09-16 DIAGNOSIS — R4182 Altered mental status, unspecified: Secondary | ICD-10-CM | POA: Diagnosis present

## 2021-09-16 DIAGNOSIS — S80211A Abrasion, right knee, initial encounter: Secondary | ICD-10-CM | POA: Diagnosis present

## 2021-09-16 LAB — I-STAT ARTERIAL BLOOD GAS, ED
Acid-Base Excess: 2 mmol/L (ref 0.0–2.0)
Bicarbonate: 29.9 mmol/L — ABNORMAL HIGH (ref 20.0–28.0)
Calcium, Ion: 1.14 mmol/L — ABNORMAL LOW (ref 1.15–1.40)
HCT: 35 % — ABNORMAL LOW (ref 39.0–52.0)
Hemoglobin: 11.9 g/dL — ABNORMAL LOW (ref 13.0–17.0)
O2 Saturation: 100 %
Patient temperature: 97.7
Potassium: 4.2 mmol/L (ref 3.5–5.1)
Sodium: 137 mmol/L (ref 135–145)
TCO2: 32 mmol/L (ref 22–32)
pCO2 arterial: 61.1 mmHg — ABNORMAL HIGH (ref 32.0–48.0)
pH, Arterial: 7.295 — ABNORMAL LOW (ref 7.350–7.450)
pO2, Arterial: 536 mmHg — ABNORMAL HIGH (ref 83.0–108.0)

## 2021-09-16 LAB — COMPREHENSIVE METABOLIC PANEL
ALT: 32 U/L (ref 0–44)
AST: 51 U/L — ABNORMAL HIGH (ref 15–41)
Albumin: 4.1 g/dL (ref 3.5–5.0)
Alkaline Phosphatase: 67 U/L (ref 38–126)
Anion gap: 20 — ABNORMAL HIGH (ref 5–15)
BUN: 17 mg/dL (ref 6–20)
CO2: 19 mmol/L — ABNORMAL LOW (ref 22–32)
Calcium: 9 mg/dL (ref 8.9–10.3)
Chloride: 100 mmol/L (ref 98–111)
Creatinine, Ser: 1.68 mg/dL — ABNORMAL HIGH (ref 0.61–1.24)
GFR, Estimated: 53 mL/min — ABNORMAL LOW (ref >60)
Glucose, Bld: 240 mg/dL — ABNORMAL HIGH (ref 70–99)
Potassium: 3.6 mmol/L (ref 3.5–5.1)
Sodium: 139 mmol/L (ref 135–145)
Total Bilirubin: 0.5 mg/dL (ref 0.3–1.2)
Total Protein: 7.4 g/dL (ref 6.5–8.1)

## 2021-09-16 LAB — PROTIME-INR
INR: 1.1 (ref 0.8–1.2)
Prothrombin Time: 13.9 seconds (ref 11.4–15.2)

## 2021-09-16 LAB — I-STAT CHEM 8, ED
BUN: 22 mg/dL — ABNORMAL HIGH (ref 6–20)
Calcium, Ion: 1.1 mmol/L — ABNORMAL LOW (ref 1.15–1.40)
Chloride: 101 mmol/L (ref 98–111)
Creatinine, Ser: 1.4 mg/dL — ABNORMAL HIGH (ref 0.61–1.24)
Glucose, Bld: 230 mg/dL — ABNORMAL HIGH (ref 70–99)
HCT: 44 % (ref 39.0–52.0)
Hemoglobin: 15 g/dL (ref 13.0–17.0)
Potassium: 3.6 mmol/L (ref 3.5–5.1)
Sodium: 140 mmol/L (ref 135–145)
TCO2: 25 mmol/L (ref 22–32)

## 2021-09-16 LAB — URINALYSIS, ROUTINE W REFLEX MICROSCOPIC
Bilirubin Urine: NEGATIVE
Glucose, UA: 150 mg/dL — AB
Hgb urine dipstick: NEGATIVE
Ketones, ur: NEGATIVE mg/dL
Leukocytes,Ua: NEGATIVE
Nitrite: NEGATIVE
Protein, ur: 30 mg/dL — AB
Specific Gravity, Urine: 1.021 (ref 1.005–1.030)
pH: 5 (ref 5.0–8.0)

## 2021-09-16 LAB — RESP PANEL BY RT-PCR (FLU A&B, COVID) ARPGX2
Influenza A by PCR: NEGATIVE
Influenza B by PCR: NEGATIVE
SARS Coronavirus 2 by RT PCR: NEGATIVE

## 2021-09-16 LAB — CBC
HCT: 44.1 % (ref 39.0–52.0)
Hemoglobin: 14.3 g/dL (ref 13.0–17.0)
MCH: 31.6 pg (ref 26.0–34.0)
MCHC: 32.4 g/dL (ref 30.0–36.0)
MCV: 97.6 fL (ref 80.0–100.0)
Platelets: 328 10*3/uL (ref 150–400)
RBC: 4.52 MIL/uL (ref 4.22–5.81)
RDW: 12.3 % (ref 11.5–15.5)
WBC: 26.2 10*3/uL — ABNORMAL HIGH (ref 4.0–10.5)
nRBC: 0 % (ref 0.0–0.2)

## 2021-09-16 LAB — VALPROIC ACID LEVEL: Valproic Acid Lvl: <10 ug/mL — ABNORMAL LOW (ref 50.0–100.0)

## 2021-09-16 LAB — MAGNESIUM: Magnesium: 2.6 mg/dL — ABNORMAL HIGH (ref 1.7–2.4)

## 2021-09-16 LAB — RAPID URINE DRUG SCREEN, HOSP PERFORMED
Amphetamines: NOT DETECTED
Barbiturates: NOT DETECTED
Benzodiazepines: POSITIVE — AB
Cocaine: POSITIVE — AB
Opiates: NOT DETECTED
Tetrahydrocannabinol: POSITIVE — AB

## 2021-09-16 LAB — CBG MONITORING, ED
Glucose-Capillary: 224 mg/dL — ABNORMAL HIGH (ref 70–99)
Glucose-Capillary: 61 mg/dL — ABNORMAL LOW (ref 70–99)
Glucose-Capillary: 111 mg/dL — ABNORMAL HIGH (ref 70–99)
Glucose-Capillary: 101 mg/dL — ABNORMAL HIGH (ref 70–99)

## 2021-09-16 LAB — GLUCOSE, CAPILLARY: Glucose-Capillary: 109 mg/dL — ABNORMAL HIGH (ref 70–99)

## 2021-09-16 LAB — HIV ANTIBODY (ROUTINE TESTING W REFLEX): HIV Screen 4th Generation wRfx: NONREACTIVE

## 2021-09-16 LAB — ETHANOL: Alcohol, Ethyl (B): <10 mg/dL (ref <10)

## 2021-09-16 LAB — MRSA NEXT GEN BY PCR, NASAL: MRSA by PCR Next Gen: NOT DETECTED

## 2021-09-16 LAB — LACTIC ACID, PLASMA
Lactic Acid, Venous: >9 mmol/L (ref 0.5–1.9)
Lactic Acid, Venous: 1.3 mmol/L (ref 0.5–1.9)

## 2021-09-16 LAB — PROCALCITONIN: Procalcitonin: 0.54 ng/mL

## 2021-09-16 LAB — CK: Total CK: 424 U/L — ABNORMAL HIGH (ref 49–397)

## 2021-09-16 MED ORDER — PROPOFOL 1000 MG/100ML IV EMUL
INTRAVENOUS | Status: AC
  Filled 2021-09-16: qty 100

## 2021-09-16 MED ORDER — DOCUSATE SODIUM 50 MG/5ML PO LIQD: 100.0000 mg | Freq: Two times a day (BID) | ORAL | Status: DC

## 2021-09-16 MED ORDER — ETOMIDATE 2 MG/ML IV SOLN
INTRAVENOUS | Status: AC | PRN
  Administered 2021-09-16: 10 mg via INTRAVENOUS

## 2021-09-16 MED ORDER — LEVETIRACETAM IN NACL 1000 MG/100ML IV SOLN
1000.0000 mg | Freq: Two times a day (BID) | INTRAVENOUS | Status: DC
  Administered 2021-09-16 – 2021-09-18 (×4): 1000 mg via INTRAVENOUS
  Filled 2021-09-16 (×3): qty 100

## 2021-09-16 MED ORDER — CHLORHEXIDINE GLUCONATE 0.12% ORAL RINSE (MEDLINE KIT)
15.0000 mL | Freq: Two times a day (BID) | OROMUCOSAL | Status: DC
  Administered 2021-09-16 – 2021-09-17 (×2): 15 mL via OROMUCOSAL

## 2021-09-16 MED ORDER — PROPOFOL 1000 MG/100ML IV EMUL
0.0000 ug/kg/min | INTRAVENOUS | Status: DC
  Administered 2021-09-16: 20 ug/kg/min via INTRAVENOUS
  Administered 2021-09-16: 13.5 ug/kg/min via INTRAVENOUS
  Administered 2021-09-16: 10 ug/kg/min via INTRAVENOUS
  Administered 2021-09-16: 15 ug/kg/min via INTRAVENOUS
  Administered 2021-09-16 – 2021-09-17 (×2): 30 ug/kg/min via INTRAVENOUS
  Filled 2021-09-16: qty 100

## 2021-09-16 MED ORDER — ACETAMINOPHEN 325 MG PO TABS
650.0000 mg | ORAL_TABLET | ORAL | Status: DC | PRN
  Administered 2021-09-17: 650 mg
  Filled 2021-09-16: qty 2

## 2021-09-16 MED ORDER — SODIUM CHLORIDE 0.9 % IV SOLN: INTRAVENOUS | Status: DC

## 2021-09-16 MED ORDER — SODIUM CHLORIDE 0.9 % IV SOLN
3.0000 g | Freq: Four times a day (QID) | INTRAVENOUS | Status: DC
  Filled 2021-09-16 (×2): qty 8

## 2021-09-16 MED ORDER — ONDANSETRON HCL 4 MG/2ML IJ SOLN
4.0000 mg | Freq: Four times a day (QID) | INTRAMUSCULAR | Status: DC | PRN
  Administered 2021-09-17: 4 mg via INTRAVENOUS
  Filled 2021-09-16: qty 2

## 2021-09-16 MED ORDER — ALBUTEROL SULFATE (2.5 MG/3ML) 0.083% IN NEBU: 2.5000 mg | INHALATION_SOLUTION | RESPIRATORY_TRACT | Status: DC | PRN

## 2021-09-16 MED ORDER — LEVETIRACETAM IN NACL 1000 MG/100ML IV SOLN
INTRAVENOUS | Status: AC
  Filled 2021-09-16: qty 100

## 2021-09-16 MED ORDER — SODIUM CHLORIDE 0.9 % IV BOLUS
1000.0000 mL | Freq: Once | INTRAVENOUS | Status: AC
  Administered 2021-09-16: 1000 mL via INTRAVENOUS

## 2021-09-16 MED ORDER — POLYETHYLENE GLYCOL 3350 17 G PO PACK: 17.0000 g | PACK | Freq: Every day | ORAL | Status: DC

## 2021-09-16 MED ORDER — MIDAZOLAM HCL 2 MG/2ML IJ SOLN
2.0000 mg | INTRAMUSCULAR | Status: DC | PRN
  Administered 2021-09-16 (×2): 2 mg via INTRAVENOUS

## 2021-09-16 MED ORDER — POLYETHYLENE GLYCOL 3350 17 G PO PACK: 17.0000 g | PACK | Freq: Every day | ORAL | Status: DC | PRN

## 2021-09-16 MED ORDER — DOCUSATE SODIUM 50 MG/5ML PO LIQD
100.0000 mg | Freq: Two times a day (BID) | ORAL | Status: DC | PRN
  Filled 2021-09-16: qty 10

## 2021-09-16 MED ORDER — DEXTROSE 5 % IV SOLN: INTRAVENOUS | Status: DC

## 2021-09-16 MED ORDER — IOHEXOL 350 MG/ML SOLN
100.0000 mL | Freq: Once | INTRAVENOUS | Status: AC | PRN
  Administered 2021-09-16: 100 mL via INTRAVENOUS

## 2021-09-16 MED ORDER — DEXTROSE 50 % IV SOLN
12.5000 g | INTRAVENOUS | Status: AC
  Administered 2021-09-16: 12.5 g via INTRAVENOUS

## 2021-09-16 MED ORDER — CHLORHEXIDINE GLUCONATE CLOTH 2 % EX PADS
6.0000 | MEDICATED_PAD | Freq: Every day | CUTANEOUS | Status: DC
  Administered 2021-09-16 – 2021-09-17 (×2): 6 via TOPICAL

## 2021-09-16 MED ORDER — MIDAZOLAM HCL 2 MG/2ML IJ SOLN: 2.0000 mg | INTRAMUSCULAR | Status: DC | PRN

## 2021-09-16 MED ORDER — MIDAZOLAM-SODIUM CHLORIDE 100-0.9 MG/100ML-% IV SOLN
0.5000 mg/h | INTRAVENOUS | Status: DC
  Administered 2021-09-16: 2 mg/h via INTRAVENOUS

## 2021-09-16 MED ORDER — INSULIN ASPART 100 UNIT/ML IJ SOLN
0.0000 [IU] | INTRAMUSCULAR | Status: DC
  Administered 2021-09-17: 2 [IU] via SUBCUTANEOUS
  Administered 2021-09-17: 3 [IU] via SUBCUTANEOUS

## 2021-09-16 MED ORDER — SUCCINYLCHOLINE CHLORIDE 20 MG/ML IJ SOLN
INTRAMUSCULAR | Status: AC | PRN
  Administered 2021-09-16: 120 mg via INTRAVENOUS

## 2021-09-16 MED ORDER — LEVETIRACETAM IN NACL 1000 MG/100ML IV SOLN
1000.0000 mg | Freq: Once | INTRAVENOUS | Status: AC
  Administered 2021-09-16: 1000 mg via INTRAVENOUS

## 2021-09-16 MED ORDER — FAMOTIDINE IN NACL 20-0.9 MG/50ML-% IV SOLN
20.0000 mg | Freq: Two times a day (BID) | INTRAVENOUS | Status: DC
  Administered 2021-09-16 – 2021-09-17 (×4): 20 mg via INTRAVENOUS
  Filled 2021-09-16 (×6): qty 50

## 2021-09-16 MED ORDER — ORAL CARE MOUTH RINSE
15.0000 mL | OROMUCOSAL | Status: DC
  Administered 2021-09-16 – 2021-09-17 (×6): 15 mL via OROMUCOSAL

## 2021-09-16 MED ORDER — LACTATED RINGERS IV SOLN: INTRAVENOUS | Status: DC

## 2021-09-16 NOTE — Progress Notes (Signed)
Pt found down at home in kitchen.possible seizure.  Pt. Sister and mother is in consultation room. Provided emotional and spiritual support.  Will follow as needed.  Venida Jarvis, Center, [facility], Pager [phone]

## 2021-09-16 NOTE — ED Notes (Signed)
Trauma nurse Marchelle Folks RN at bedside.

## 2021-09-16 NOTE — Consult Note (Signed)
Reason for Consult:Found down, scalp hematoma Referring Physician: Ardelia Mems is an 40 y.o. male.  HPI: 40yo M brought in by EMS after being found down, naked in a kitchen.  Reportedly had a small scalp hematoma.  Was agitated and received Versed in route.  Also received Narcan.  On arrival, Dr. Rosalia Hammers determined he needed a definitive airway and she proceeded with intubation.  He was activated to a level 1 trauma at that time.  On my arrival, intubation was being accomplished.  Nursing tech was attempting to place a Foley catheter and he localized to that region.  GCS E1 V1T M5 = 7T. A family member arrived and reported that he has a history of seizure disorder and polysubstance abuse.  Past Medical History:  Diagnosis Date   Asthma    Seizure (HCC)    hx of for years, license taken 2015    Past Surgical History:  Procedure Laterality Date   URETHRA SURGERY  2012   WISDOM TOOTH EXTRACTION      Family History  Problem Relation Age of Onset   Hypothyroidism Mother    Cancer Mother        uterine   Mesothelioma Father    Depression Sister     Social History:  reports that he has been smoking cigarettes. He has a 7.50 pack-year smoking history. He has never used smokeless tobacco. He reports current alcohol use. He reports that he does not currently use drugs after having used the following drugs: Marijuana.  Allergies: No Known Allergies  Medications: I have reviewed the patient's current medications.  Results for orders placed or performed during the hospital encounter of 09/16/21 (from the past 48 hour(s))  CBG monitoring, ED     Status: Abnormal   Collection Time: 09/16/21  8:59 AM  Result Value Ref Range   Glucose-Capillary 224 (H) 70 - 99 mg/dL    Comment: Glucose reference range applies only to samples taken after fasting for at least 8 hours.  Comprehensive metabolic panel     Status: Abnormal   Collection Time: 09/16/21  9:06 AM  Result Value Ref  Range   Sodium 139 135 - 145 mmol/L   Potassium 3.6 3.5 - 5.1 mmol/L   Chloride 100 98 - 111 mmol/L   CO2 19 (L) 22 - 32 mmol/L   Glucose, Bld 240 (H) 70 - 99 mg/dL    Comment: Glucose reference range applies only to samples taken after fasting for at least 8 hours.   BUN 17 6 - 20 mg/dL   Creatinine, Ser 0.86 (H) 0.61 - 1.24 mg/dL   Calcium 9.0 8.9 - 76.1 mg/dL   Total Protein 7.4 6.5 - 8.1 g/dL   Albumin 4.1 3.5 - 5.0 g/dL   AST 51 (H) 15 - 41 U/L   ALT 32 0 - 44 U/L   Alkaline Phosphatase 67 38 - 126 U/L   Total Bilirubin 0.5 0.3 - 1.2 mg/dL   GFR, Estimated 53 (L) >60 mL/min    Comment: (NOTE) Calculated using the CKD-EPI Creatinine Equation (2021)    Anion gap 20 (H) 5 - 15    Comment: Performed at [facility], [address] [place], [place] [address]  CBC     Status: Abnormal   Collection Time: 09/16/21  9:06 AM  Result Value Ref Range   WBC 26.2 (H) 4.0 - 10.5 K/uL   RBC 4.52 4.22 - 5.81 MIL/uL   Hemoglobin 14.3 13.0 -  17.0 g/dL   HCT 70.3 50.0 - 93.8 %   MCV 97.6 80.0 - 100.0 fL   MCH 31.6 26.0 - 34.0 pg   MCHC 32.4 30.0 - 36.0 g/dL   RDW 18.2 99.3 - 71.6 %   Platelets 328 150 - 400 K/uL   nRBC 0.0 0.0 - 0.2 %    Comment: Performed at [facility], [address], [place], [place] [address]  I-stat chem 8, ed     Status: Abnormal   Collection Time: 09/16/21  9:07 AM  Result Value Ref Range   Sodium 140 135 - 145 mmol/L   Potassium 3.6 3.5 - 5.1 mmol/L   Chloride 101 98 - 111 mmol/L   BUN 22 (H) 6 - 20 mg/dL   Creatinine, Ser 3.81 (H) 0.61 - 1.24 mg/dL   Glucose, Bld 017 (H) 70 - 99 mg/dL    Comment: Glucose reference range applies only to samples taken after fasting for at least 8 hours.   Calcium, Ion 1.10 (L) 1.15 - 1.40 mmol/L   TCO2 25 22 - 32 mmol/L   Hemoglobin 15.0 13.0 - 17.0 g/dL   HCT 51.0 25.8 - 52.7 %  Urinalysis, Routine w reflex microscopic     Status: Abnormal   Collection Time: 09/16/21  9:13 AM  Result Value Ref Range    Color, Urine YELLOW YELLOW   APPearance CLEAR CLEAR   Specific Gravity, Urine 1.021 1.005 - 1.030   pH 5.0 5.0 - 8.0   Glucose, UA 150 (A) NEGATIVE mg/dL   Hgb urine dipstick NEGATIVE NEGATIVE   Bilirubin Urine NEGATIVE NEGATIVE   Ketones, ur NEGATIVE NEGATIVE mg/dL   Protein, ur 30 (A) NEGATIVE mg/dL   Nitrite NEGATIVE NEGATIVE   Leukocytes,Ua NEGATIVE NEGATIVE   RBC / HPF 0-5 0 - 5 RBC/hpf   WBC, UA 0-5 0 - 5 WBC/hpf   Bacteria, UA RARE (A) NONE SEEN   Squamous Epithelial / LPF 0-5 0 - 5   Mucus PRESENT    Hyaline Casts, UA PRESENT     Comment: Performed at [facility], [address], [place], [place] [address]  I-Stat arterial blood gas, [facility] ED)     Status: Abnormal   Collection Time: 09/16/21 10:05 AM  Result Value Ref Range   pH, Arterial 7.295 (L) 7.350 - 7.450   pCO2 arterial 61.1 (H) 32.0 - 48.0 mmHg   pO2, Arterial 536 (H) 83.0 - 108.0 mmHg   Bicarbonate 29.9 (H) 20.0 - 28.0 mmol/L   TCO2 32 22 - 32 mmol/L   O2 Saturation 100.0 %   Acid-Base Excess 2.0 0.0 - 2.0 mmol/L   Sodium 137 135 - 145 mmol/L   Potassium 4.2 3.5 - 5.1 mmol/L   Calcium, Ion 1.14 (L) 1.15 - 1.40 mmol/L   HCT 35.0 (L) 39.0 - 52.0 %   Hemoglobin 11.9 (L) 13.0 - 17.0 g/dL   Patient temperature 35.3 F    Collection site RADIAL, ALLEN'S TEST ACCEPTABLE    Drawn by Operator    Sample type ARTERIAL     CT HEAD WO CONTRAST  Result Date: 09/16/2021 CLINICAL DATA:  40 year old male found down, unresponsive. History of seizure. Scalp hematoma. EXAM: CT HEAD WITHOUT CONTRAST TECHNIQUE: Contiguous axial images were obtained from the base of the skull through the vertex without intravenous contrast. COMPARISON:  Head CT 12/23/2016. FINDINGS: Brain: Cerebral volume is stable since 2018. No midline shift, ventriculomegaly, mass effect, evidence of mass lesion, intracranial hemorrhage  or evidence of cortically based acute infarction. Gray-white matter differentiation is within normal limits throughout  the brain. Vascular: No suspicious intracranial vascular hyperdensity. Skull: No fracture identified. Sinuses/Orbits: Bubbly low-density fluid in the left maxillary and posterior ethmoid sinuses. But other Visualized paranasal sinuses and mastoids are stable and well aerated. Tympanic cavities are clear. Other: Mild broad-based posterior convexity scalp hematoma on series 4, image 44. No soft tissue gas. Underlying calvarium appears intact. Other orbit and scalp soft tissues appear negative. IMPRESSION: 1. Mild posterior convexity scalp hematoma without underlying skull fracture. 2. Stable and normal noncontrast CT appearance of the brain. Electronically Signed   By: Odessa Fleming M.D.   On: 09/16/2021 10:08   CT CERVICAL SPINE WO CONTRAST  Result Date: 09/16/2021 CLINICAL DATA:  40 year old male found down, unresponsive. History of seizure. Scalp hematoma. EXAM: CT CERVICAL SPINE WITHOUT CONTRAST TECHNIQUE: Multidetector CT imaging of the cervical spine was performed without intravenous contrast. Multiplanar CT image reconstructions were also generated. COMPARISON:  Head CT today.  Cervical spine CT 06/25/2013. FINDINGS: Alignment: Preserved cervical lordosis. Cervicothoracic junction alignment is within normal limits. Bilateral posterior element alignment is within normal limits. Skull base and vertebrae: Visualized skull base is intact. No atlanto-occipital dissociation. C1 and C2 appear intact and aligned. No acute osseous abnormality identified. Soft tissues and spinal canal: No prevertebral fluid or swelling. No visible canal hematoma. Visible endotracheal tube and enteric tube positioning is satisfactory. Negative visible noncontrast neck soft tissues. Disc levels: Mild posterior disc and endplate degeneration at C5-C6 has developed since 2014. Upper chest: Chest CT today is reported separately. Mild T3 superior endplate conchae cavity is stable since 2014. Negative visible lung apices. IMPRESSION: 1. No acute  traumatic injury identified in the cervical spine. 2. Mild C5-C6 disc and endplate degeneration. 3. Chest CT reported separately. Electronically Signed   By: Odessa Fleming M.D.   On: 09/16/2021 10:10   CT CHEST ABDOMEN PELVIS W CONTRAST  Result Date: 09/16/2021 CLINICAL DATA:  40 year old male found down, unresponsive. History of seizure. Scalp hematoma. EXAM: CT CHEST, ABDOMEN, AND PELVIS WITH CONTRAST TECHNIQUE: Multidetector CT imaging of the chest, abdomen and pelvis was performed following the standard protocol during bolus administration of intravenous contrast. CONTRAST:  OMNIPAQUE IOHEXOL 350 MG/ML SOLN COMPARISON:  Cervical spine CT today. CT Abdomen and Pelvis 06/25/2013. FINDINGS: CT CHEST FINDINGS Cardiovascular: Negative. Thoracic aorta appears intact with a 4 vessel arch configuration (left vertebral arises directly from the arch, normal variant). No cardiomegaly or pericardial effusion. Mediastinum/Nodes: Negative. No mediastinal hematoma or lymphadenopathy. Enteric tube courses through the thoracic esophagus to the abdomen. Lungs/Pleura: Endotracheal tube tip is at the level the clavicles. Major airways are patent. There is minor dependent opacity in both lungs which most resembles atelectasis. No pneumothorax, pleural effusion, or pulmonary contusion. Musculoskeletal: No acute osseous abnormality identified. Mild compression of the T3 superior endplate appears chronic and stable as reported on the cervical spine CT today. CT ABDOMEN PELVIS FINDINGS Hepatobiliary: Negative liver and gallbladder. Pancreas: Negative. Spleen: Negative. Adrenals/Urinary Tract: Normal adrenal glands. Kidneys appears symmetric and normal. Normal contrast excretion on delayed images. Bladder is decompressed by a Foley catheter which likely accounts for the appearance of bladder wall thickening. Delayed bladder excretory images were obtained and are unremarkable. Stomach/Bowel: Negative large and small bowel. Normal  appendix on coronal image 29. Enteric tube loops in the stomach which is mildly to moderately distended with both fluid and gas. Duodenum is decompressed. No free air, free fluid, or mesenteric  inflammation. Vascular/Lymphatic: Major arterial structures in the abdomen and pelvis appear patent and normal. Patent portal venous system. No lymphadenopathy. Reproductive: Urethral catheter in place, otherwise negative. Other: No pelvic free fluid.  Occasional pelvic phleboliths. Musculoskeletal: L5-S1 disc degeneration has progressed since 2014 including new vacuum disc there. No acute osseous abnormality identified. No superficial soft tissue injury identified. IMPRESSION: 1. No acute traumatic injury identified in the chest, abdomen, or pelvis. 2. Satisfactory lines and tubes.  Minor pulmonary atelectasis. Electronically Signed   By: Odessa Fleming M.D.   On: 09/16/2021 10:16   DG Chest Port 1 View  Result Date: 09/16/2021 CLINICAL DATA:  Seizure activity, found down EXAM: PORTABLE CHEST 1 VIEW COMPARISON:  12/23/2016 FINDINGS: Endotracheal tube 3.7 cm above the carina. NG tube enters the stomach and loops in the fundus with the tip not visualized. Normal heart size and vascularity. No focal pneumonia, collapse or consolidation. Negative for edema, effusion or pneumothorax. No acute osseous finding. IMPRESSION: Support apparatus in good position. No other acute chest process. Electronically Signed   By: Judie Petit.  Shick M.D.   On: 09/16/2021 09:22    Review of Systems  Unable to perform ROS: Intubated  Blood pressure 100/69, pulse 77, temperature 97.7 F (36.5 C), temperature source Oral, resp. rate 18, height  (1.727 m), weight 72.6 kg, SpO2 100 %. Physical Exam Constitutional:      Appearance: Normal appearance. He is not diaphoretic.  HENT:     Head:     Comments: ? Tiny scalp hematoma occiput on L    Right Ear: External ear normal.     Left Ear: External ear normal.     Nose: Nose normal.      Mouth/Throat:     Mouth: Mucous membranes are moist.  Eyes:     General: No scleral icterus.    Pupils: Pupils are equal, round, and reactive to light.  Neck:     Comments: Collar on Cardiovascular:     Rate and Rhythm: Normal rate and regular rhythm.     Pulses: Normal pulses.     Heart sounds: Normal heart sounds.  Pulmonary:     Effort: Pulmonary effort is normal.     Breath sounds: Normal breath sounds.  Abdominal:     General: Abdomen is flat. There is no distension.     Palpations: Abdomen is soft. There is no mass.     Tenderness: There is no abdominal tenderness. There is no guarding or rebound.  Genitourinary:    Penis: Normal.      Testes: Normal.  Musculoskeletal:        General: No swelling or tenderness.     Cervical back: No rigidity.  Skin:    General: Skin is warm and dry.     Capillary Refill: Capillary refill takes less than 2 seconds.  Neurological:     Comments: GCS E1 V1T M5 = 7T    Assessment/Plan: 40 year old found down with possible seizure and possible drug abuse.  Intubated for airway protection, acute hypoxic respiratory failure, and need to complete evaluation safely. CT head, C-spine, chest, abdomen, and pelvis reviewed with radiology.  No acute injuries.  I have cleared his cervical spine.  I discussed with Dr. Rosalia Hammers -recommend admission by CCM with neurologic work-up.  UDS is pending. Critical care Liz Malady 09/16/2021, 10:25 AM

## 2021-09-16 NOTE — H&P (Addendum)
NAME:  Erik Hoffman, MRN:  [id number], DOB:  1981-08-07, LOS: 0 ADMISSION DATE:  09/16/2021, CONSULTATION DATE:  09/16/21 REFERRING MD:  Rosalia Hammers, CHIEF COMPLAINT:  Seizures requiring intubation   History of Present Illness:  40 year old male with a history of poly-substance abuse, asthma, and seizure disorder  was found on the kitchen floor with contusion to head ,  altered mental status and incontinent. Last known normal at 3:30 am . EMS reports that on their arrival he was altered with decreased responsiveness.  They noted a contusion to his head, and pinpoint pupils.  He came agitated and confused  in the field after receiving intra nasal Narcan and received a total of 12.5 mg Versed and Haldol 5 mg IM as he was combative. ( He received 1 dose of narcan intra-nasally and one IV).It took several Tree surgeon to restrain patient.   Pt.  has a history of seizure disorder, unsure of compliance .  Per family patient was recently started on Keppra , but has had breakthrough seizures. He sees Sherian Rein. Neurologist for his seizure disorder. Per family he is very secretive about his drug use. Drugs of choice are Adderall , and Xanax ,  Cocaine and opioids more recently.   In the ED patient required intubation to protect his airway. He waxed and waned between unresponsiveness  and combativeness. He was intubated  for a definitive airway, and he is currently sedated on Versed and Propofol . CT head , Abdomen and pelvis and cervical spine were negative .   Labs reviewed include WBC of 26.2, HGB 14.3, platelets 328 Na 139, K 3.6, CO2 19, Creatinine 1.68, AST of 51, ALT 32, Glucose 240, Gap of 20 UA with glucose of 150, Protein of 30, rare bacteria  Ethanol < 10,  Covid negative Urine Drug Screen + for cocaine, Benzoes, Tetrahydrocannabinol Initial ABG after intubation was 7.295/ 61.1/536/29.9, repeat ABG is pending  EEG has been ordered.   As patient was intubated and sedated, PCCM  have been asked to admit and manage care .   Pertinent  Medical History   Past Medical History:  Diagnosis Date   Asthma    Seizure (HCC)    hx of for years, license taken 2015   Polysubstance Abuse   Significant Hospital Events: Including procedures, antibiotic start and stop dates in addition to other pertinent events   11/29 Admitted for seizure disorder  Interim History / Subjective:  Sedated and intubated on full vent support. Hemodynamically stable  Objective   Blood pressure 100/69, pulse 77, temperature 97.7 F (36.5 C), temperature source Oral, resp. rate 18, height 5\' 8"  (1.727 m), weight 72.6 kg, SpO2 100 %.    Vent Mode: PRVC FiO2 (%):  [40 %-100 %] 40 % Set Rate:  [18 bmp-26 bmp] 26 bmp Vt Set:  [550 mL] 550 mL PEEP:  [5 cmH20] 5 cmH20 Plateau Pressure:  [9 cmH20] 9 cmH20  No intake or output data in the 24 hours ending 09/16/21 1026 Filed Weights   09/16/21 0918  Weight: 72.6 kg    Examination: General: Sedated and intubated male, stable , in NAD HENT: [place], scalp contusion , Oral abrasions , ETT, OG tube  Lungs: Bilateral chest excursion, Clear throughout, No accessory muscle use Cardiovascular: S1, S2, RRR, No RMG Abdomen:  Soft, NT, ND, BS +, Body mass index is 24.34 kg/m. Extremities: No obvious deformities, brisk cap refill, abrasions to knees bilaterally, no edema Neuro: Sedated and intubated, no  obvious seizure activity  Skin: Abrasions to knees, chest and abdomen GU: Foley with clear amber urine  Resolved Hospital Problem list     Assessment & Plan:  Uncontrolled Seizure disorder , Polysubstance abuse  AMS Recently started on Keppra Plan Neuro Consult EEG Versed gtt for now Loaded with Keppra 1 gram in ED Valproic Acid level ordered Seizure Precautions   Failure to Protect Airway requiring Intubation in ED Hx. Of asthma  Plan Repeat ABG 1 hour after arrival to ICU Wean PEEP and FiO2 as able Saturation Goal > 94% Extubation plans  once neuro has evaluated for continued seizures Albuterol prn for wheezing  VAP Bundle, Propofol/ Versed , goal RASS 0/-1 for now CXR in am and prn  ABG in am and prn   Leukocytosis T Max 97.7 Plan Trend WBC and Fever Curve Trend PCT Start Unasyn for suspected aspiration pneumonia Tracheal aspirate for culture  Polysubstance abuse  Benzo/ opioid / Cocaine  Plan Continue propofol and versed for now Will need counseling as an OP for addiction   Hyperglycemia  Glucose in urine  GAP is 20 Plan HGB A1C CBG Q 4 with SSI  Proteinuria, acute vs chronic  30 mg/dL per UA Plan CK LR at 150 cc / hr Evaluate for UTI Repeat UA after hydration  Trend BMET Monitor UO Check A1C to ensure not related to DM.      Best Practice (right click and "Reselect all SmartList Selections" daily)   Diet/type: NPO DVT prophylaxis: prophylactic heparin  GI prophylaxis: H2B Lines: N/A Foley:  Yes, and it is still needed Code Status:  full code Last date of multidisciplinary goals of care discussion [Dr. Shearon Stalls updated family 11/29 at bedside in ED. ]  Labs   CBC: Recent Labs  Lab 09/16/21 0906 09/16/21 0907 09/16/21 1005  WBC 26.2*  --   --   HGB 14.3 15.0 11.9*  HCT 44.1 44.0 35.0*  MCV 97.6  --   --   PLT 328  --   --     Basic Metabolic Panel: Recent Labs  Lab 09/16/21 0906 09/16/21 0907 09/16/21 1005  NA 139 140 137  K 3.6 3.6 4.2  CL 100 101  --   CO2 19*  --   --   GLUCOSE 240* 230*  --   BUN 17 22*  --   CREATININE 1.68* 1.40*  --   CALCIUM 9.0  --   --    GFR: Estimated Creatinine Clearance: 68.5 mL/min (A) (by C-G formula based on SCr of 1.4 mg/dL (H)). Recent Labs  Lab 09/16/21 0906  WBC 26.2*    Liver Function Tests: Recent Labs  Lab 09/16/21 0906  AST 51*  ALT 32  ALKPHOS 67  BILITOT 0.5  PROT 7.4  ALBUMIN 4.1   No results for input(s): LIPASE, AMYLASE in the last 168 hours. No results for input(s): AMMONIA in the last 168 hours.  ABG     Component Value Date/Time   PHART 7.295 (L) 09/16/2021 1005   PCO2ART 61.1 (H) 09/16/2021 1005   PO2ART 536 (H) 09/16/2021 1005   HCO3 29.9 (H) 09/16/2021 1005   TCO2 32 09/16/2021 1005   O2SAT 100.0 09/16/2021 1005     Coagulation Profile: No results for input(s): INR, PROTIME in the last 168 hours.  Cardiac Enzymes: No results for input(s): CKTOTAL, CKMB, CKMBINDEX, TROPONINI in the last 168 hours.  HbA1C: No results found for: HGBA1C  CBG: Recent Labs  Lab 09/16/21 0859  GLUCAP 224*    Review of Systems:   Unable as patient is sedated and intubated  Past Medical History:  He,  has a past medical history of Asthma and Seizure ([place]).   Surgical History:   Past Surgical History:  Procedure Laterality Date   URETHRA SURGERY  2012   WISDOM TOOTH EXTRACTION       Social History:   reports that he has been smoking cigarettes. He has a 7.50 pack-year smoking history. He has never used smokeless tobacco. He reports current alcohol use. He reports that he does not currently use drugs after having used the following drugs: Marijuana.   Family History:  His family history includes Cancer in his mother; Depression in his sister; Hypothyroidism in his mother; Mesothelioma in his father.   Allergies No Known Allergies   Home Medications  Prior to Admission medications   Medication Sig Start Date End Date Taking? Authorizing Provider  acetaminophen (TYLENOL) 500 MG tablet Take 500 mg by mouth every 6 (six) hours as needed for moderate pain.    [provider]  ibuprofen (ADVIL) 200 MG tablet Take 600 mg by mouth every 6 (six) hours as needed for moderate pain.    [provider]  divalproex (DEPAKOTE) 500 MG DR tablet Take 1 tablet (500 mg total) by mouth 2 (two) times daily. Patient not taking: Reported on 07/22/2019 09/07/18 07/23/19  Ripley Fraise, MD     Critical care time: 48 minutes    Magdalen Spatz, MSN, AGACNP-BC [place] for personal pager PCCM on call pager [phone]  09/16/2021 12:05 PM

## 2021-09-16 NOTE — ED Notes (Signed)
Dr. Rosalia Hammers updated mother & daughter at bedside at this time.
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
## 2021-09-16 NOTE — ED Notes (Signed)
Trauma Response Nurse Note-  Reason for Call / Reason for Trauma activation:   - Found down in kitchen - unknown how long, laceration to back of head, possible seizure and polysubstance history  Initial Focused Assessment (If applicable, or please see trauma documentation):  - Patient already in department being intubated on my arrival. No obvious signs of trauma, GCS 7 while placing foley, pupils 2 sluggish  Interventions:  - IVs, trauma labs, coude foley and UDS - Intubation, CXR - CT head/cspine, C/A/P  Plan of Care as of this note:  - All imaging negative for traumatic injury, CCM consulted for airway, Neuro for possible sz

## 2021-09-16 NOTE — Procedures (Signed)
Patient Name: Erik Hoffman  MRN: [id number]  Epilepsy Attending: Charlsie Quest  Referring Physician/Provider: Dr. Caryl Pina Date: 09/16/2021 Duration: 23.35 mins  Patient history: 40 year old male found down after possible seizure and drug use.  EEG to evaluate for seizures.  Level of alertness: comatose  AEDs during EEG study: Propofol  Technical aspects: This EEG study was done with scalp electrodes positioned according to the 10-20 International system of electrode placement. Electrical activity was acquired at a sampling rate of 500Hz  and reviewed with a high frequency filter of 70Hz  and a low frequency filter of 1Hz . EEG data were recorded continuously and digitally stored.   Description: EEG showed continuous generalized 3 to 5 Hz theta-delta slowing. There is also an excessive amount of 15 to 18 Hz beta activity with irregular morphology distributed symmetrically and diffusely. Hyperventilation and photic stimulation were not performed.     ABNORMALITY - Continuous slow, generalized - Excessive beta, generalized  IMPRESSION: This study is suggestive of severe diffuse encephalopathy, nonspecific to etiology but most likely due to sedation.  No seizures or epileptiform discharges were seen throughout the recording.  Chanette Demo 

## 2021-09-16 NOTE — Progress Notes (Signed)
Patient ID: Erik Hoffman, male   DOB: [birth year], 40 y.o.   MRN: [id number] Late entry. Nursing staff was unable to place a foley after several attempts. Shortly after intubation I placed a coude foley using sterile technique. UDS sent.  Violeta Gelinas, MD, MPH, FACS Please use [email or web address] to contact on call provider

## 2021-09-16 NOTE — Consult Note (Incomplete)
NEURO HOSPITALIST CONSULT NOTE   Requestig physician: Dr. Rosalia Hammers  Reason for Consult: Seizure recurrence while on Keppra  History obtained from:  Patient   Chart  Patient and Chart   ***  HPI:                                                                                                                                          Erik Hoffman is an 40 y.o. male ***  Past Medical History:  Diagnosis Date   Asthma    Seizure (HCC)    hx of for years, license taken 2015    Past Surgical History:  Procedure Laterality Date   URETHRA SURGERY  2012   WISDOM TOOTH EXTRACTION      Family History  Problem Relation Age of Onset   Hypothyroidism Mother    Cancer Mother        uterine   Mesothelioma Father    Depression Sister             Social History:  reports that he has been smoking cigarettes. He has a 7.50 pack-year smoking history. He has never used smokeless tobacco. He reports current alcohol use. He reports that he does not currently use drugs after having used the following drugs: Marijuana.  No Known Allergies  HOME MEDICATIONS:                                                                                                                      No current facility-administered medications on file prior to encounter.   Current Outpatient Medications on File Prior to Encounter  Medication Sig Dispense Refill   acetaminophen (TYLENOL) 500 MG tablet Take 500-1,000 mg by mouth every 6 (six) hours as needed for mild pain, moderate pain or headache.     ALEVE 220 MG tablet Take 220-440 mg by mouth 2 (two) times daily as needed (for mild pain or headaches).     levETIRAcetam (KEPPRA) 500 MG tablet Take 500 mg by mouth 2 (two) times daily.     [DISCONTINUED] divalproex (DEPAKOTE) 500 MG DR tablet Take 1 tablet (500 mg total) by mouth 2 (two) times daily. (Patient not taking: Reported on 07/22/2019) 14 tablet 0     ROS:  Patient is unable to provide a ROS due to sedation.    Blood pressure 101/69, pulse 91, temperature 97.7 F (36.5 C), temperature source Oral, resp. rate (!) 26, height 5\' 8"  (1.727 m), weight 72.6 kg, SpO2 100 %.   General Examination:                                                                                                       Physical Exam  HEENT-  [place]/AT    Lungs- Intubated Extremities- No edema  Neurological Examination Mental Status: Intubated and sedated with propofol gtt at a rate of 33. Will localize to sternal rub and furrow brow. Briefly opened eyes to noxious. Not following commands. No attempts to communicate.  Cranial Nerves: II: No blink to threat. Does not track or fixate.   III,IV, VI: No doll's eye reflex. Eyes are near the midline, slightly exotropic. No nystagmus.  V/VII: Weak corneal reflexes bilaterally. Face flaccidly symmetric.  VIII: No response to voice IX,X: Intubated XI: Head is midline XII: Intubated Motor/Sensory: LUE localizes to sternal rub with 2/5 strength. RUE with minimal movement to sternal rub. BLE with weak withdrawal to noxious plantar stimulation bilaterally  Tone decreased x 4 Deep Tendon Reflexes: 2+ and symmetric bilateral brachioradialis and biceps 3+ bilateral patellae. 2+ bilateral achilles.  Plantars: Equivocal bilaterally Cerebellar/Gait: Unable to assess   Lab Results: Basic Metabolic Panel: Recent Labs  Lab 09/16/21 0906 09/16/21 0907 09/16/21 1005 09/16/21 1012  NA 139 140 137  --   K 3.6 3.6 4.2  --   CL 100 101  --   --   CO2 19*  --   --   --   GLUCOSE 240* 230*  --   --   BUN 17 22*  --   --   CREATININE 1.68* 1.40*  --   --   CALCIUM 9.0  --   --   --   MG  --   --   --  2.6*    CBC: Recent Labs  Lab 09/16/21 0906 09/16/21 0907 09/16/21 1005  WBC 26.2*  --   --   HGB 14.3 15.0 11.9*  HCT 44.1  44.0 35.0*  MCV 97.6  --   --   PLT 328  --   --     Cardiac Enzymes: Recent Labs  Lab 09/16/21 1012  CKTOTAL 424*    Lipid Panel: No results for input(s): CHOL, TRIG, HDL, CHOLHDL, VLDL, LDLCALC in the last 168 hours.  Imaging: CT HEAD WO CONTRAST  Result Date: 09/16/2021 CLINICAL DATA:  40 year old male found down, unresponsive. History of seizure. Scalp hematoma. EXAM: CT HEAD WITHOUT CONTRAST TECHNIQUE: Contiguous axial images were obtained from the base of the skull through the vertex without intravenous contrast. COMPARISON:  Head CT 12/23/2016. FINDINGS: Brain: Cerebral volume is stable since 2018. No midline shift, ventriculomegaly, mass effect, evidence of mass lesion, intracranial hemorrhage or evidence of cortically based acute infarction. Gray-white matter differentiation is within normal limits throughout the brain. Vascular: No suspicious intracranial vascular hyperdensity. Skull: No fracture identified. Sinuses/Orbits:  Bubbly low-density fluid in the left maxillary and posterior ethmoid sinuses. But other Visualized paranasal sinuses and mastoids are stable and well aerated. Tympanic cavities are clear. Other: Mild broad-based posterior convexity scalp hematoma on series 4, image 44. No soft tissue gas. Underlying calvarium appears intact. Other orbit and scalp soft tissues appear negative. IMPRESSION: 1. Mild posterior convexity scalp hematoma without underlying skull fracture. 2. Stable and normal noncontrast CT appearance of the brain. Electronically Signed   By: Odessa Fleming M.D.   On: 09/16/2021 10:08   CT CERVICAL SPINE WO CONTRAST  Result Date: 09/16/2021 CLINICAL DATA:  40 year old male found down, unresponsive. History of seizure. Scalp hematoma. EXAM: CT CERVICAL SPINE WITHOUT CONTRAST TECHNIQUE: Multidetector CT imaging of the cervical spine was performed without intravenous contrast. Multiplanar CT image reconstructions were also generated. COMPARISON:  Head CT today.   Cervical spine CT 06/25/2013. FINDINGS: Alignment: Preserved cervical lordosis. Cervicothoracic junction alignment is within normal limits. Bilateral posterior element alignment is within normal limits. Skull base and vertebrae: Visualized skull base is intact. No atlanto-occipital dissociation. C1 and C2 appear intact and aligned. No acute osseous abnormality identified. Soft tissues and spinal canal: No prevertebral fluid or swelling. No visible canal hematoma. Visible endotracheal tube and enteric tube positioning is satisfactory. Negative visible noncontrast neck soft tissues. Disc levels: Mild posterior disc and endplate degeneration at C5-C6 has developed since 2014. Upper chest: Chest CT today is reported separately. Mild T3 superior endplate conchae cavity is stable since 2014. Negative visible lung apices. IMPRESSION: 1. No acute traumatic injury identified in the cervical spine. 2. Mild C5-C6 disc and endplate degeneration. 3. Chest CT reported separately. Electronically Signed   By: Odessa Fleming M.D.   On: 09/16/2021 10:10   CT CHEST ABDOMEN PELVIS W CONTRAST  Result Date: 09/16/2021 CLINICAL DATA:  40 year old male found down, unresponsive. History of seizure. Scalp hematoma. EXAM: CT CHEST, ABDOMEN, AND PELVIS WITH CONTRAST TECHNIQUE: Multidetector CT imaging of the chest, abdomen and pelvis was performed following the standard protocol during bolus administration of intravenous contrast. CONTRAST:  OMNIPAQUE IOHEXOL 350 MG/ML SOLN COMPARISON:  Cervical spine CT today. CT Abdomen and Pelvis 06/25/2013. FINDINGS: CT CHEST FINDINGS Cardiovascular: Negative. Thoracic aorta appears intact with a 4 vessel arch configuration (left vertebral arises directly from the arch, normal variant). No cardiomegaly or pericardial effusion. Mediastinum/Nodes: Negative. No mediastinal hematoma or lymphadenopathy. Enteric tube courses through the thoracic esophagus to the abdomen. Lungs/Pleura: Endotracheal tube tip  is at the level the clavicles. Major airways are patent. There is minor dependent opacity in both lungs which most resembles atelectasis. No pneumothorax, pleural effusion, or pulmonary contusion. Musculoskeletal: No acute osseous abnormality identified. Mild compression of the T3 superior endplate appears chronic and stable as reported on the cervical spine CT today. CT ABDOMEN PELVIS FINDINGS Hepatobiliary: Negative liver and gallbladder. Pancreas: Negative. Spleen: Negative. Adrenals/Urinary Tract: Normal adrenal glands. Kidneys appears symmetric and normal. Normal contrast excretion on delayed images. Bladder is decompressed by a Foley catheter which likely accounts for the appearance of bladder wall thickening. Delayed bladder excretory images were obtained and are unremarkable. Stomach/Bowel: Negative large and small bowel. Normal appendix on coronal image 29. Enteric tube loops in the stomach which is mildly to moderately distended with both fluid and gas. Duodenum is decompressed. No free air, free fluid, or mesenteric inflammation. Vascular/Lymphatic: Major arterial structures in the abdomen and pelvis appear patent and normal. Patent portal venous system. No lymphadenopathy. Reproductive: Urethral catheter in place, otherwise negative. Other: No  pelvic free fluid.  Occasional pelvic phleboliths. Musculoskeletal: L5-S1 disc degeneration has progressed since 2014 including new vacuum disc there. No acute osseous abnormality identified. No superficial soft tissue injury identified. IMPRESSION: 1. No acute traumatic injury identified in the chest, abdomen, or pelvis. 2. Satisfactory lines and tubes.  Minor pulmonary atelectasis. Electronically Signed   By: Odessa Fleming M.D.   On: 09/16/2021 10:16   DG Chest Port 1 View  Result Date: 09/16/2021 CLINICAL DATA:  Seizure activity, found down EXAM: PORTABLE CHEST 1 VIEW COMPARISON:  12/23/2016 FINDINGS: Endotracheal tube 3.7 cm above the carina. NG tube enters the  stomach and loops in the fundus with the tip not visualized. Normal heart size and vascularity. No focal pneumonia, collapse or consolidation. Negative for edema, effusion or pneumothorax. No acute osseous finding. IMPRESSION: Support apparatus in good position. No other acute chest process. Electronically Signed   By: Judie Petit.  Shick M.D.   On: 09/16/2021 09:22   EEG adult  Result Date: 09/16/2021 Charlsie Quest, MD     09/16/2021  1:29 PM Patient Name: Erik Hoffman MRN: [id number] Epilepsy Attending: Charlsie Quest Referring Physician/Provider: Dr. Caryl Pina Date: 09/16/2021 Duration: 23.35 mins Patient history: 40 year old male found down after possible seizure and drug use.  EEG to evaluate for seizures. Level of alertness: comatose AEDs during EEG study: Propofol Technical aspects: This EEG study was done with scalp electrodes positioned according to the 10-20 International system of electrode placement. Electrical activity was acquired at a sampling rate of 500Hz  and reviewed with a high frequency filter of 70Hz  and a low frequency filter of 1Hz . EEG data were recorded continuously and digitally stored. Description: EEG showed continuous generalized 3 to 5 Hz theta-delta slowing. There is also an excessive amount of 15 to 18 Hz beta activity with irregular morphology distributed symmetrically and diffusely. Hyperventilation and photic stimulation were not performed.   ABNORMALITY - Continuous slow, generalized - Excessive beta, generalized IMPRESSION: This study is suggestive of severe diffuse encephalopathy, nonspecific to etiology but most likely due to sedation.  No seizures or epileptiform discharges were seen throughout the recording. Priyanka    Assessment: 1. Exam reveals a sedated patient without adventitious movements suggestive of clinical seizure activity.  VPA level < 10 as expected  Recommendations: 1. Increasing Keppra to 1000 mg IV BID. Next dose at 9:30 PM.  2.  Keppra level.  3. Inpatient seizure precautions. 4. Outpatient seizure precautions. Per [facility] statutes, patients with seizures are not allowed to drive until  they have been seizure-free for six months. Use caution when using heavy equipment or power tools. Avoid working on ladders or at heights. Take showers instead of baths. Ensure the water temperature is not too high on the home water heater. Do not go swimming alone. When caring for infants or small children, sit down when holding, feeding, or changing them to minimize risk of injury to the child in the event you have a seizure. Also, Maintain good sleep hygiene. Avoid alcohol.     Electronically signed: Dr. 09/16/2021, 3:29 PM

## 2021-09-16 NOTE — Progress Notes (Signed)
Pharmacy Antibiotic Note  Erik Hoffman is a 40 y.o. male admitted on 09/16/2021 with  aspiration PNA .  Pharmacy has been consulted for Unasyn dosing. SCr 1.4 on admit.  Plan: Unasyn 3g IV q6h Monitor clinical progress, c/s, renal function F/u de-escalation plan/LOT  Height: 5\' 8"  (172.7 cm) Weight: 72.6 kg (160 lb 0.9 oz) IBW/kg (Calculated) : 68.4  Temp (24hrs), Avg:97.7 F (36.5 C), Min:97.7 F (36.5 C), Max:97.7 F (36.5 C)  Recent Labs  Lab 09/16/21 0906 09/16/21 0907 09/16/21 0947  WBC 26.2*  --   --   CREATININE 1.68* 1.40*  --   LATICACIDVEN  --   --  >9.0*    Estimated Creatinine Clearance: 68.5 mL/min (A) (by C-G formula based on SCr of 1.4 mg/dL (H)).    No Known Allergies   09/18/21, PharmD, BCPS Please check AMION for all [facility] contact numbers Clinical Pharmacist 09/16/2021 12:21 PM

## 2021-09-16 NOTE — TOC CAGE-AID Note (Signed)
Transition of Care [facility]) - CAGE-AID Screening   Patient Details  Name: Erik Hoffman MRN: [id number] Date of Birth: 1981/06/17  Transition of Care [facility]) CM/SW Contact:    Wisam Siefring C Tarpley-Carter, LCSWA Phone Number: 09/16/2021, 11:57 AM   Clinical Narrative: Pt participated in Cage-Aid.  Pt stated he does use substance, but does not drink ETOH.  Pt was offered resources, due to polysubstance usage.   Kelicia Youtz Tarpley-Carter, MSW, LCSW-A Pronouns:  She/Her/Hers [facility] Clinical Social Worker Direct Number:  [phone] [email or web address]     CAGE-AID Screening:    Have You Ever Felt You Ought to Cut Down on Your Drinking or Drug Use?: Yes Have People Annoyed You By [employer] Your Drinking Or Drug Use?: Yes Have You Felt Bad Or Guilty About Your Drinking Or Drug Use?: No Have You Ever Had a Drink or Used Drugs First Thing In The Morning to Steady Your Nerves or to Get Rid of a Hangover?: No CAGE-AID Score: 2  Substance Abuse Education Offered: Yes  Substance abuse interventions: Transport planner

## 2021-09-16 NOTE — ED Notes (Signed)
ICU provider at bedside

## 2021-09-16 NOTE — Progress Notes (Signed)
Pharmacy Quick Note  Erik Hoffman is a 40 y.o. male admitted on 09/16/2021 with seizure and history of taking keppra 500mg  BID PTA.  Reviewed patient home medication bottle at bedside.  Last filled: 08/31/2021 for total of #60 tablets Currently 49 tablets in bottle 09/16/2021 Expected count 29 tablets  Thank you for allowing pharmacy to participate in this patient's care.  09/18/2021, PharmD PGY1 Acute Care Resident  09/16/2021,4:53 PM

## 2021-09-16 NOTE — ED Notes (Signed)
SWOT RN & RT has been called & is coming to transport pt up to 4N31, report has already been called & charted to the accepting RN.

## 2021-09-16 NOTE — ED Notes (Signed)
Neurology at bedside.

## 2021-09-16 NOTE — ED Notes (Addendum)
Warm NS hung by Gaynelle Cage.

## 2021-09-16 NOTE — Progress Notes (Signed)
EEG complete - results pending 

## 2021-09-16 NOTE — ED Notes (Signed)
Patient transported to CT with Trauma RN 

## 2021-09-16 NOTE — ED Notes (Signed)
Pt's mother at bedside.

## 2021-09-16 NOTE — Progress Notes (Signed)
RT transported patient from ED 22 to 4N31. No complications. Report given to 4N RT.

## 2021-09-16 NOTE — ED Provider Notes (Signed)
[facility] EMERGENCY DEPARTMENT Provider Note   CSN: [id number] Arrival date & time: 09/16/21  0844     History Chief Complaint  Patient presents with   Combative   Possible Seizure   OD    Erik Hoffman is a 40 y.o. male.  HPI Level 5 caveat History obtained initially from EMS 39 year old male found on the kitchen floor with contusion to head and altered mental status.  EMS reports that on their arrival he was altered with decreased responsiveness.  They noted a contusion to his head.  He also noted that he had pinpoint pupils.  He came agitated and received Versed.  He has a history of seizure disorder.  Due to the pinpoint pupils he was also given Narcan.  He then became more alert and agitated but remained confused.  He received Haldol and Versed.  He was transported on along spineboard with a nonrebreather in place.  Prehospital CBG was 242.  In route patient continued unresponsive but appeared to have normal respirations.  There is blood noted about his mouth and no reported emesis.     Past Medical History:  Diagnosis Date   Asthma    Seizure (HCC)    hx of for years, license taken 2015    Patient Active Problem List   Diagnosis Date Noted   Seizures (HCC) 09/16/2021   Generalized idiopathic epilepsy and epileptic syndromes, without status epilepticus, not intractable (HCC) 02/12/2015    Past Surgical History:  Procedure Laterality Date   URETHRA SURGERY  2012   WISDOM TOOTH EXTRACTION         Family History  Problem Relation Age of Onset   Hypothyroidism Mother    Cancer Mother        uterine   Mesothelioma Father    Depression Sister     Social History   Tobacco Use   Smoking status: Some Days    Packs/day: 0.50    Years: 15.00    Pack years: 7.50    Types: Cigarettes   Smokeless tobacco: Never   Tobacco comments:    03/04/16 < 1 PPD, 05/19/17 < 1/2 PPD  Substance Use Topics   Alcohol use: Yes    Alcohol/week: 0.0  standard drinks    Comment: Rare, "very moderate"   Drug use: Not Currently    Types: Marijuana    Comment: daily use    Home Medications Prior to Admission medications   Medication Sig Start Date End Date Taking? Authorizing Provider  acetaminophen (TYLENOL) 500 MG tablet Take 500 mg by mouth every 6 (six) hours as needed for moderate pain.    [provider]  ibuprofen (ADVIL) 200 MG tablet Take 600 mg by mouth every 6 (six) hours as needed for moderate pain.    [provider]  divalproex (DEPAKOTE) 500 MG DR tablet Take 1 tablet (500 mg total) by mouth 2 (two) times daily. Patient not taking: Reported on 07/22/2019 09/07/18 07/23/19  Zadie Rhine, MD    Allergies    Patient has no known allergies.  Review of Systems   Review of Systems  Unable to perform ROS: Acuity of condition   Physical Exam Updated Vital Signs BP 102/72   Pulse 81   Temp 97.7 F (36.5 C) (Oral)   Resp (!) 26   Ht 1.727 m (5\' 8" )   Wt 72.6 kg   SpO2 100%   BMI 24.34 kg/m   Physical Exam Vitals and nursing note reviewed.  Constitutional:      General: He is in acute distress.     Appearance: He is ill-appearing.  HENT:     Head: Normocephalic.     Comments: Some blood noted around mouth    Right Ear: External ear normal.     Left Ear: External ear normal.     Nose: Nose normal.     Mouth/Throat:     Mouth: Mucous membranes are dry.     Pharynx: Oropharynx is clear.  Eyes:     Comments: Pupils are small and reactive.  Neck:     Comments: Trachea is midline Some JVD is noted Collar placed in ED No obvious external signs of trauma Cardiovascular:     Rate and Rhythm: Normal rate and regular rhythm.  Pulmonary:     Comments: Patient with decreased respiratory effort  Some small amount of inspiratory stridor Decreased breath sounds right base versus left base Abdominal:     General: Abdomen is flat.     Palpations: Abdomen is soft.     Comments: Abrasion upper  abdomen  Genitourinary:    Penis: Normal.   Musculoskeletal:     Comments: Abrasions bilateral knees No obvious external signs of trauma on back  Skin:    General: Skin is warm and dry.     Capillary Refill: Capillary refill takes less than 2 seconds.  Neurological:     Comments: Patient unresponsive Patient has some decorticate posturing noted Patient localize to pain when Foley catheter was being placed    ED Results / Procedures / Treatments   Labs (all labs ordered are listed, but only abnormal results are displayed) Labs Reviewed  COMPREHENSIVE METABOLIC PANEL - Abnormal; Notable for the following components:      Result Value   CO2 19 (*)    Glucose, Bld 240 (*)    Creatinine, Ser 1.68 (*)    AST 51 (*)    GFR, Estimated 53 (*)    Anion gap 20 (*)    All other components within normal limits  CBC - Abnormal; Notable for the following components:   WBC 26.2 (*)    All other components within normal limits  URINALYSIS, ROUTINE W REFLEX MICROSCOPIC - Abnormal; Notable for the following components:   Glucose, UA 150 (*)    Protein, ur 30 (*)    Bacteria, UA RARE (*)    All other components within normal limits  LACTIC ACID, PLASMA - Abnormal; Notable for the following components:   Lactic Acid, Venous >9.0 (*)    All other components within normal limits  CBG MONITORING, ED - Abnormal; Notable for the following components:   Glucose-Capillary 224 (*)    All other components within normal limits  I-STAT CHEM 8, ED - Abnormal; Notable for the following components:   BUN 22 (*)    Creatinine, Ser 1.40 (*)    Glucose, Bld 230 (*)    Calcium, Ion 1.10 (*)    All other components within normal limits  I-STAT ARTERIAL BLOOD GAS, ED - Abnormal; Notable for the following components:   pH, Arterial 7.295 (*)    pCO2 arterial 61.1 (*)    pO2, Arterial 536 (*)    Bicarbonate 29.9 (*)    Calcium, Ion 1.14 (*)    HCT 35.0 (*)    Hemoglobin 11.9 (*)    All other components  within normal limits  RESP PANEL BY RT-PCR (FLU A&B, COVID) ARPGX2  CULTURE, BLOOD (ROUTINE X 2)  CULTURE, BLOOD (ROUTINE X 2)  ETHANOL  PROTIME-INR  BLOOD GAS, ARTERIAL  RAPID URINE DRUG SCREEN, HOSP PERFORMED  VALPROIC ACID LEVEL  HIV ANTIBODY (ROUTINE TESTING W REFLEX)  MAGNESIUM  I-STAT CHEM 8, ED  SAMPLE TO BLOOD BANK    EKG EKG Interpretation  Date/Time:  Tuesday September 16 2021 09:59:05 EST Ventricular Rate:  78 PR Interval:  156 QRS Duration: 101 QT Interval:  433 QTC Calculation: 494 R Axis:   90 Text Interpretation: Sinus rhythm Borderline right axis deviation Borderline prolonged QT interval Confirmed by Margarita Grizzle [phone]) on 09/16/2021 10:24:07 AM  Radiology CT HEAD WO CONTRAST  Result Date: 09/16/2021 CLINICAL DATA:  40 year old male found down, unresponsive. History of seizure. Scalp hematoma. EXAM: CT HEAD WITHOUT CONTRAST TECHNIQUE: Contiguous axial images were obtained from the base of the skull through the vertex without intravenous contrast. COMPARISON:  Head CT 12/23/2016. FINDINGS: Brain: Cerebral volume is stable since 2018. No midline shift, ventriculomegaly, mass effect, evidence of mass lesion, intracranial hemorrhage or evidence of cortically based acute infarction. Gray-white matter differentiation is within normal limits throughout the brain. Vascular: No suspicious intracranial vascular hyperdensity. Skull: No fracture identified. Sinuses/Orbits: Bubbly low-density fluid in the left maxillary and posterior ethmoid sinuses. But other Visualized paranasal sinuses and mastoids are stable and well aerated. Tympanic cavities are clear. Other: Mild broad-based posterior convexity scalp hematoma on series 4, image 44. No soft tissue gas. Underlying calvarium appears intact. Other orbit and scalp soft tissues appear negative. IMPRESSION: 1. Mild posterior convexity scalp hematoma without underlying skull fracture. 2. Stable and normal noncontrast CT appearance  of the brain. Electronically Signed   By: Odessa Fleming M.D.   On: 09/16/2021 10:08   CT CERVICAL SPINE WO CONTRAST  Result Date: 09/16/2021 CLINICAL DATA:  40 year old male found down, unresponsive. History of seizure. Scalp hematoma. EXAM: CT CERVICAL SPINE WITHOUT CONTRAST TECHNIQUE: Multidetector CT imaging of the cervical spine was performed without intravenous contrast. Multiplanar CT image reconstructions were also generated. COMPARISON:  Head CT today.  Cervical spine CT 06/25/2013. FINDINGS: Alignment: Preserved cervical lordosis. Cervicothoracic junction alignment is within normal limits. Bilateral posterior element alignment is within normal limits. Skull base and vertebrae: Visualized skull base is intact. No atlanto-occipital dissociation. C1 and C2 appear intact and aligned. No acute osseous abnormality identified. Soft tissues and spinal canal: No prevertebral fluid or swelling. No visible canal hematoma. Visible endotracheal tube and enteric tube positioning is satisfactory. Negative visible noncontrast neck soft tissues. Disc levels: Mild posterior disc and endplate degeneration at C5-C6 has developed since 2014. Upper chest: Chest CT today is reported separately. Mild T3 superior endplate conchae cavity is stable since 2014. Negative visible lung apices. IMPRESSION: 1. No acute traumatic injury identified in the cervical spine. 2. Mild C5-C6 disc and endplate degeneration. 3. Chest CT reported separately. Electronically Signed   By: Odessa Fleming M.D.   On: 09/16/2021 10:10   CT CHEST ABDOMEN PELVIS W CONTRAST  Result Date: 09/16/2021 CLINICAL DATA:  40 year old male found down, unresponsive. History of seizure. Scalp hematoma. EXAM: CT CHEST, ABDOMEN, AND PELVIS WITH CONTRAST TECHNIQUE: Multidetector CT imaging of the chest, abdomen and pelvis was performed following the standard protocol during bolus administration of intravenous contrast. CONTRAST:  OMNIPAQUE IOHEXOL 350 MG/ML SOLN  COMPARISON:  Cervical spine CT today. CT Abdomen and Pelvis 06/25/2013. FINDINGS: CT CHEST FINDINGS Cardiovascular: Negative. Thoracic aorta appears intact with a 4 vessel arch configuration (left vertebral arises directly from the arch, normal variant). No cardiomegaly  or pericardial effusion. Mediastinum/Nodes: Negative. No mediastinal hematoma or lymphadenopathy. Enteric tube courses through the thoracic esophagus to the abdomen. Lungs/Pleura: Endotracheal tube tip is at the level the clavicles. Major airways are patent. There is minor dependent opacity in both lungs which most resembles atelectasis. No pneumothorax, pleural effusion, or pulmonary contusion. Musculoskeletal: No acute osseous abnormality identified. Mild compression of the T3 superior endplate appears chronic and stable as reported on the cervical spine CT today. CT ABDOMEN PELVIS FINDINGS Hepatobiliary: Negative liver and gallbladder. Pancreas: Negative. Spleen: Negative. Adrenals/Urinary Tract: Normal adrenal glands. Kidneys appears symmetric and normal. Normal contrast excretion on delayed images. Bladder is decompressed by a Foley catheter which likely accounts for the appearance of bladder wall thickening. Delayed bladder excretory images were obtained and are unremarkable. Stomach/Bowel: Negative large and small bowel. Normal appendix on coronal image 29. Enteric tube loops in the stomach which is mildly to moderately distended with both fluid and gas. Duodenum is decompressed. No free air, free fluid, or mesenteric inflammation. Vascular/Lymphatic: Major arterial structures in the abdomen and pelvis appear patent and normal. Patent portal venous system. No lymphadenopathy. Reproductive: Urethral catheter in place, otherwise negative. Other: No pelvic free fluid.  Occasional pelvic phleboliths. Musculoskeletal: L5-S1 disc degeneration has progressed since 2014 including new vacuum disc there. No acute osseous abnormality identified. No  superficial soft tissue injury identified. IMPRESSION: 1. No acute traumatic injury identified in the chest, abdomen, or pelvis. 2. Satisfactory lines and tubes.  Minor pulmonary atelectasis. Electronically Signed   By: Odessa Fleming M.D.   On: 09/16/2021 10:16   DG Chest Port 1 View  Result Date: 09/16/2021 CLINICAL DATA:  Seizure activity, found down EXAM: PORTABLE CHEST 1 VIEW COMPARISON:  12/23/2016 FINDINGS: Endotracheal tube 3.7 cm above the carina. NG tube enters the stomach and loops in the fundus with the tip not visualized. Normal heart size and vascularity. No focal pneumonia, collapse or consolidation. Negative for edema, effusion or pneumothorax. No acute osseous finding. IMPRESSION: Support apparatus in good position. No other acute chest process. Electronically Signed   By: Judie Petit.  Shick M.D.   On: 09/16/2021 09:22    Procedures Date/Time: 09/16/2021 9:34 AM Performed by: Margarita Grizzle, MD Pre-anesthesia Checklist: Patient identified, Emergency Drugs available, Suction available, Patient being monitored and Timeout performed Oxygen Delivery Method: Non-rebreather mask Preoxygenation: Pre-oxygenation with 100% oxygen Induction Type: Rapid sequence Ventilation: Mask ventilation without difficulty Laryngoscope Size: Glidescope and 4 Tube size: 8.0 mm Number of attempts: 1 Airway Equipment and Method: Patient positioned with wedge pillow and Video-laryngoscopy Placement Confirmation: ETT inserted through vocal cords under direct vision, Positive ETCO2, CO2 detector and Breath sounds checked- equal and bilateral Secured at: 23 cm Tube secured with: ETT holder Dental Injury: Teeth and Oropharynx as per pre-operative assessment     .Critical Care Performed by: Margarita Grizzle, MD Authorized by: Margarita Grizzle, MD   Critical care provider statement:    Critical care time (minutes):  65   Critical care end time:  09/16/2021 11:12 AM   Critical care was necessary to treat or prevent imminent  or life-threatening deterioration of the following conditions:  CNS failure or compromise and respiratory failure   Critical care was time spent personally by me on the following activities:  Development of treatment plan with patient or surrogate, discussions with consultants, evaluation of patient's response to treatment, examination of patient, ordering and review of laboratory studies, ordering and review of radiographic studies, ordering and performing treatments and interventions, pulse oximetry, re-evaluation of patient's  condition, review of old charts and blood draw for specimens   Medications Ordered in ED Medications  propofol (DIPRIVAN) 1000 MG/100ML infusion (10 mcg/kg/min  72.6 kg Intravenous Bolus 09/16/21 0917)  midazolam (VERSED) injection 2 mg (has no administration in time range)  midazolam (VERSED) injection 2 mg (2 mg Intravenous Bolus 09/16/21 0950)  docusate sodium (COLACE) capsule 100 mg (has no administration in time range)  polyethylene glycol (MIRALAX / GLYCOLAX) packet 17 g (has no administration in time range)  famotidine (PEPCID) IVPB 20 mg premix (has no administration in time range)  0.9 %  sodium chloride infusion (has no administration in time range)  acetaminophen (TYLENOL) tablet 650 mg (has no administration in time range)  ondansetron (ZOFRAN) injection 4 mg (has no administration in time range)  albuterol (PROVENTIL) (2.5 MG/3ML) 0.083% nebulizer solution 2.5 mg (has no administration in time range)  etomidate (AMIDATE) injection (10 mg Intravenous Given 09/16/21 0907)  succinylcholine (ANECTINE) injection (120 mg Intravenous Given 09/16/21 0907)  levETIRAcetam (KEPPRA) IVPB 1000 mg/100 mL premix (0 mg Intravenous Stopped 09/16/21 0959)  iohexol (OMNIPAQUE) 350 MG/ML injection 100 mL (100 mLs Intravenous Contrast Given 09/16/21 0944)  sodium chloride 0.9 % bolus 1,000 mL (1,000 mLs Intravenous New Bag/Given 09/16/21 1042)    ED Course  I have reviewed  the triage vital signs and the nursing notes.  Pertinent labs & imaging results that were available during my care of the patient were reviewed by me and considered in my medical decision making (see chart for details).  Clinical Course as of 09/16/21 1113  Tue Sep 16, 2021  1024 ABG noted- vent changes per RT CBC with significant leukocytosis-likely secondary to demargination- no fever, no focal infiltrates,-?aspiration with blood in airway Creatinine increased at 1.68- fluid bolus ordered, foley in place an uop being monitored Otherwise electrolytes normal Awating uds  [DR]    Clinical Course User Index [DR] Margarita Grizzle, MD   MDM Rules/Calculators/A&P                         Patient found on ground with contusion to head and altered mental status.  Patient elevated to level 1 trauma. Differential diagnosis including trauma, seizure, overdose, metabolic, toxic, or infectious etiology. CT, labs, urine drug screen, EtOH pending Dr. Janee Morn at bedside Patient being sedated with propofol at this time. 1 g Keppra ordered Discussed with critical care who will see for admission Neurology page- will need eeg Discussed with Dr. Otelia Limes Dr. Celine Mans at bedside  Final Clinical Impression(s) / ED Diagnoses Final diagnoses:  Trauma  Altered mental status, unspecified altered mental status type  Respiratory failure, unspecified chronicity, unspecified whether with hypoxia or hypercapnia [facility])    Rx / DC Orders ED Discharge Orders     None        Margarita Grizzle, MD 09/16/21 1113

## 2021-09-16 NOTE — ED Triage Notes (Signed)
Pt BIB [facility] from home d/t being found naked & incontinent in his kitchen floor possible had a seizure & was seen to have oral trauma & a mark to the back of his head. Hx of seizures combative with EMS & fire. Was given 2.5 IV ativan then IV was pulled out, then 5 mg Haldol IM d/t continued combative & 5 mg IM ativan given. EMS gave 1 intranasal narcan initially & then 1 in his IV that aroused him before he became combative. CBG was reported "high"& no c-collar in place upon arrival, pt still sedated.

## 2021-09-16 NOTE — ED Notes (Signed)
Sliding scale for pt stated activation of standing order hypoglycemia protocol since pt's CBG was 61.  Desi, Olene Craven, MD was made aware that this RN did initiate that.

## 2021-09-16 NOTE — ED Notes (Addendum)
Trauma provider Janee Morn, MD at bedside.

## 2021-09-16 NOTE — Progress Notes (Signed)
RT transported patient to from ED 22 to CT and back with RN. No complications.   ABG one hour post intubation: 7.29/61.6/536/29.9 RT increased RR from 18 to 26 and decreased FiO2 from 100% to 40%. Dr.Ray aware.   RT will continue to monitor.

## 2021-09-16 NOTE — ED Notes (Signed)
EEG at bedside.

## 2021-09-17 ENCOUNTER — Inpatient Hospital Stay (HOSPITAL_COMMUNITY): Payer: 59

## 2021-09-17 DIAGNOSIS — J9601 Acute respiratory failure with hypoxia: Secondary | ICD-10-CM

## 2021-09-17 DIAGNOSIS — R569 Unspecified convulsions: Secondary | ICD-10-CM | POA: Diagnosis not present

## 2021-09-17 LAB — POCT I-STAT 7, (LYTES, BLD GAS, ICA,H+H)
Acid-Base Excess: 2 mmol/L (ref 0.0–2.0)
Bicarbonate: 23.4 mmol/L (ref 20.0–28.0)
Calcium, Ion: 1.14 mmol/L — ABNORMAL LOW (ref 1.15–1.40)
HCT: 33 % — ABNORMAL LOW (ref 39.0–52.0)
Hemoglobin: 11.2 g/dL — ABNORMAL LOW (ref 13.0–17.0)
O2 Saturation: 100 %
Patient temperature: 100.5
Potassium: 3.3 mmol/L — ABNORMAL LOW (ref 3.5–5.1)
Sodium: 138 mmol/L (ref 135–145)
TCO2: 24 mmol/L (ref 22–32)
pCO2 arterial: 28.2 mmHg — ABNORMAL LOW (ref 32.0–48.0)
pH, Arterial: 7.531 — ABNORMAL HIGH (ref 7.350–7.450)
pO2, Arterial: 150 mmHg — ABNORMAL HIGH (ref 83.0–108.0)

## 2021-09-17 LAB — PROCALCITONIN: Procalcitonin: 1.91 ng/mL

## 2021-09-17 LAB — CBC
HCT: 34.9 % — ABNORMAL LOW (ref 39.0–52.0)
Hemoglobin: 12.1 g/dL — ABNORMAL LOW (ref 13.0–17.0)
MCH: 31.6 pg (ref 26.0–34.0)
MCHC: 34.7 g/dL (ref 30.0–36.0)
MCV: 91.1 fL (ref 80.0–100.0)
Platelets: 193 10*3/uL (ref 150–400)
RBC: 3.83 MIL/uL — ABNORMAL LOW (ref 4.22–5.81)
RDW: 13 % (ref 11.5–15.5)
WBC: 11.4 10*3/uL — ABNORMAL HIGH (ref 4.0–10.5)
nRBC: 0 % (ref 0.0–0.2)

## 2021-09-17 LAB — GLUCOSE, CAPILLARY
Glucose-Capillary: 105 mg/dL — ABNORMAL HIGH (ref 70–99)
Glucose-Capillary: 107 mg/dL — ABNORMAL HIGH (ref 70–99)
Glucose-Capillary: 110 mg/dL — ABNORMAL HIGH (ref 70–99)
Glucose-Capillary: 111 mg/dL — ABNORMAL HIGH (ref 70–99)
Glucose-Capillary: 142 mg/dL — ABNORMAL HIGH (ref 70–99)
Glucose-Capillary: 153 mg/dL — ABNORMAL HIGH (ref 70–99)
Glucose-Capillary: 87 mg/dL (ref 70–99)

## 2021-09-17 LAB — BASIC METABOLIC PANEL
Anion gap: 7 (ref 5–15)
BUN: 9 mg/dL (ref 6–20)
CO2: 24 mmol/L (ref 22–32)
Calcium: 8.1 mg/dL — ABNORMAL LOW (ref 8.9–10.3)
Chloride: 102 mmol/L (ref 98–111)
Creatinine, Ser: 1.22 mg/dL (ref 0.61–1.24)
GFR, Estimated: >60 mL/min (ref >60)
Glucose, Bld: 98 mg/dL (ref 70–99)
Potassium: 3.4 mmol/L — ABNORMAL LOW (ref 3.5–5.1)
Sodium: 133 mmol/L — ABNORMAL LOW (ref 135–145)

## 2021-09-17 LAB — LACTIC ACID, PLASMA: Lactic Acid, Venous: 1.3 mmol/L (ref 0.5–1.9)

## 2021-09-17 LAB — HEMOGLOBIN A1C
Hgb A1c MFr Bld: 5.7 % — ABNORMAL HIGH (ref 4.8–5.6)
Mean Plasma Glucose: 117 mg/dL

## 2021-09-17 LAB — TRIGLYCERIDES: Triglycerides: 55 mg/dL (ref <150)

## 2021-09-17 LAB — MAGNESIUM: Magnesium: 2 mg/dL (ref 1.7–2.4)

## 2021-09-17 MED ORDER — ALBUTEROL SULFATE (2.5 MG/3ML) 0.083% IN NEBU: 2.5000 mg | INHALATION_SOLUTION | RESPIRATORY_TRACT | Status: DC | PRN

## 2021-09-17 MED ORDER — POTASSIUM CHLORIDE 20 MEQ PO PACK
40.0000 meq | PACK | Freq: Once | ORAL | Status: AC
  Administered 2021-09-17: 40 meq
  Filled 2021-09-17: qty 2

## 2021-09-17 MED ORDER — HEPARIN SODIUM (PORCINE) 5000 UNIT/ML IJ SOLN
5000.0000 [IU] | Freq: Three times a day (TID) | INTRAMUSCULAR | Status: DC
  Administered 2021-09-17 – 2021-09-18 (×2): 5000 [IU] via SUBCUTANEOUS
  Filled 2021-09-17 (×2): qty 1

## 2021-09-17 NOTE — Progress Notes (Addendum)
Neurology Progress Note  Patient ID: Erik Hoffman is a 40 y.o. with PMHx of  has a past medical history of Asthma and Seizure (HCC).  Initially consulted for: Seizure activity   Major interval events:  Extubated.   Subjective: Patient tearful throughout exam. But is coherent and conversant.   Exam: Vitals:   09/17/21 1400 09/17/21 1500  BP: 113/81 123/81  Pulse: 73 70  Resp: 16 (!) 21  Temp:    SpO2: 99% 96%   Gen: In bed, comfortable  Resp: non-labored breathing, no grossly audible wheezing Cardiac: Perfusing extremities well  Abd: soft, nt  Neuro: Ment: awake, alert and oriented x 4. No deficits noted  CN: II-XII intact without any obvious deficit  Motor: moves all extremities 5/5 strenght throughout  Sensory:intact throughout  PYP:PJKDTO 2+ throughout     Assessment: 40 year old right handed white male with history of seizure disorder and noncompliance, as well as polysubstance abuse presents with recurrent seizures. Intubated for airway protection. CT head shows no intracranial abnormality. UTOX positive for cocaine and THC. - Exam today: Patient is extubated and neurologically intact.  - Exam in the ED yesterday was most consistent with combined sedation and postictal state. No clinical seizure activity noted.  - CT head: Mild posterior convexity scalp hematoma without underlying skull fracture. Stable and normal noncontrast CT appearance of the brain. - EEG: Continuous slow, generalized; excessive beta, generalized. This study is suggestive of severe diffuse encephalopathy, nonspecific to etiology but most likely due to sedation.  No seizures or epileptiform discharges were seen throughout the recording.    Recommendations: -Inpatient seizure precautions -Repeat brain CT if there is significant neurologic decline -CIWA protocol. -Ativan PRN seizure recurrence and call Neurology -Continue Keppra at 1000 mg IV BID (new increase relative to his home  dose) -Counseling: -Cocaine cessation counseling -THC cessation. Not likely reducing seizure frequency and may in fact be exacerbating his condition due to the neuroexcitatory effects of this recreational drug.  -Counsel compliance with Keppra.  -Outpatient seizure precautions: Per [facility] statutes, patients with seizures are not allowed to drive until  they have been seizure-free for six months. Use caution when using heavy equipment or power tools. Avoid working on ladders or at heights. Take showers instead of baths. Ensure the water temperature is not too high on the home water heater. Do not go swimming alone. When caring for infants or small children, sit down when holding, feeding, or changing them to minimize risk of injury to the child in the event you have a seizure. Also, Maintain good sleep hygiene. Avoid alcohol. -Follow up with outpt neurologist for epilepsy management. -We will sign off at this time but remain available to assist. We discussed [place] driving restrictions on drivers with epilepsy.   Electronically signed: Dr. Caryl Pina

## 2021-09-17 NOTE — Procedures (Signed)
Extubation Procedure Note  Patient Details:   Name: Erik Hoffman DOB: 15-May-1981 MRN: [id number]   Airway Documentation:    Vent end date: 09/17/21 Vent end time: 0900   Evaluation  O2 sats: stable throughout Complications: No apparent complications Patient did tolerate procedure well. Bilateral Breath Sounds: Clear, Diminished   Yes Per order, pt was extubated to 4L [place]. Prior to extubation pt did have positive cuff leak. Pt tolerated well with SVS. Pt was able to state his name. RT will continue to monitor pt.  Hart Rochester R 09/17/2021, 9:06 AM

## 2021-09-17 NOTE — Progress Notes (Signed)
Report given to 74M RN. Transfer to floor to be completed by night RN.  Patient and mother aware of transfer.

## 2021-09-17 NOTE — Progress Notes (Signed)
[facility] ADULT ICU REPLACEMENT PROTOCOL   The patient does apply for the [facility] Adult ICU Electrolyte Replacment Protocol based on the criteria listed below:   1.Exclusion criteria: TCTS patients, ECMO patients, and Dialysis patients 2. Is GFR >/= 30 ml/min? Yes.    Patient's GFR today is >60 3. Is SCr </= 2? Yes.   Patient's SCr is 1.22 mg/dL 4. Did SCr increase >/= 0.5 in 24 hours? No. 5.Pt's weight >40kg  Yes.   6. Abnormal electrolyte(s): K+ 3.4  7. Electrolytes replaced per protocol 8.  Call MD STAT for K+ </= 2.5, Phos </= 1, or Mag </= 1 Physician:  n/a  Melvern Banker 09/17/2021 6:20 AM

## 2021-09-17 NOTE — Plan of Care (Signed)

## 2021-09-17 NOTE — Plan of Care (Signed)
  Problem: Activity: Goal: Risk for activity intolerance will decrease Outcome: Progressing   Problem: Coping: Goal: Level of anxiety will decrease Outcome: Progressing   Problem: Pain Managment: Goal: General experience of comfort will improve Outcome: Progressing   Problem: Coping: Goal: Ability to adjust to condition or change in health will improve Outcome: Progressing   Problem: Self-Concept: Goal: Level of anxiety will decrease Outcome: Progressing

## 2021-09-17 NOTE — Progress Notes (Signed)
NAME:  Erik Hoffman, MRN:  [id number], DOB:  03-Aug-1981, LOS: 1 ADMISSION DATE:  09/16/2021, CONSULTATION DATE:  09/16/21 REFERRING MD:  Rosalia Hammers, CHIEF COMPLAINT:  Seizures requiring intubation   History of Present Illness:  40 year old male with a history of poly-substance abuse, asthma, and seizure disorder  was found on the kitchen floor with contusion to head ,  altered mental status and incontinent. Last known normal at 3:30 am . EMS reports that on their arrival he was altered with decreased responsiveness.  They noted a contusion to his head, and pinpoint pupils.  He came agitated and confused  in the field after receiving intra nasal Narcan and received a total of 12.5 mg Versed and Haldol 5 mg IM as he was combative. ( He received 1 dose of narcan intra-nasally and one IV).It took several Tree surgeon to restrain patient.   Pt.  has a history of seizure disorder, unsure of compliance .  Per family patient was recently started on Keppra , but has had breakthrough seizures. He sees Sherian Rein. Neurologist for his seizure disorder. Per family he is very secretive about his drug use. Drugs of choice are Adderall , and Xanax ,  Cocaine and opioids more recently.   In the ED patient required intubation to protect his airway. He waxed and waned between unresponsiveness  and combativeness. He was intubated  for a definitive airway, and he is currently sedated on Versed and Propofol . CT head , Abdomen and pelvis and cervical spine were negative .   Labs reviewed include WBC of 26.2, HGB 14.3, platelets 328 Na 139, K 3.6, CO2 19, Creatinine 1.68, AST of 51, ALT 32, Glucose 240, Gap of 20 UA with glucose of 150, Protein of 30, rare bacteria  Ethanol < 10,  Covid negative Urine Drug Screen + for cocaine, Benzoes, Tetrahydrocannabinol Initial ABG after intubation was 7.295/ 61.1/536/29.9, repeat ABG is pending  EEG has been ordered.   As patient was intubated and sedated, PCCM  have been asked to admit and manage care .   Pertinent  Medical History   Past Medical History:  Diagnosis Date   Asthma    Seizure (HCC)    hx of for years, license taken 2015   Polysubstance Abuse   Significant Hospital Events: Including procedures, antibiotic start and stop dates in addition to other pertinent events   11/29 Admitted for seizure disorder  Interim History / Subjective:   No new issues reported overnight T-max 101.9 overnight Currently on propofol 20, planning to begin weaning this morning Tolerating some PSV I/O+ 1.4 L total Hypokalemia, hyponatremia  Objective   Blood pressure 112/78, pulse 77, temperature (!) 100.5 F (38.1 C), temperature source Axillary, resp. rate (!) 22, height 5\' 8"  (1.727 m), weight 70.2 kg, SpO2 100 %.    Vent Mode: PRVC FiO2 (%):  [40 %-100 %] 40 % Set Rate:  [18 bmp-26 bmp] 22 bmp Vt Set:  [550 mL] 550 mL PEEP:  [5 cmH20] 5 cmH20 Plateau Pressure:  [9 cmH20-15 cmH20] 15 cmH20   Intake/Output Summary (Last 24 hours) at 09/17/2021 0814 Last data filed at 09/17/2021 0600 Gross per 24 hour  Intake 3154.34 ml  Output 1730 ml  Net 1424.34 ml   Filed Weights   09/16/21 0918 09/16/21 1712  Weight: 72.6 kg 70.2 kg    Examination: General: Thin man, intubated, sedated HENT: ET tube in place, scalp contusion, pupils equal Lungs: Clear bilaterally, no wheeze or crackles  Cardiovascular: Regular, no murmur Abdomen: Soft, nondistended, positive bowel sounds Extremities: No edema Neuro: Wakes to voice, nods to questions, follows commands on propofol 20 Skin: Knee abrasions, no rash GU: Foley catheter in place  Resolved Hospital Problem list     Assessment & Plan:  Uncontrolled Seizure disorder , Polysubstance abuse  AMS Recently started on Keppra Plan -appreciate neurology input -EEG on 11/29 with diffuse slowing, no epileptogenicity, no overt seizures -Continue Keppra and will need to counsel adherence as an  outpatient -Valproate discontinued -Seizure precautions  Failure to Protect Airway requiring Intubation in ED Hx. Of asthma  Plan -Push for SBT, goal extubation on 11/30 as neurological issues resolved -Albuterol as needed -VAP prevention orders -Pulmonary hygiene  Leukocytosis T Max 101.9 Plan -Unasyn considered on 11/29 given fever and risk for aspiration pneumonia, but not ordered as chest x-ray reassuring.  Continue to follow -Follow procalcitonin, rising 11/30 -Blood cultures pending, do not see that respiratory cultures have been obtained  Polysubstance abuse  Benzo/ opioid / Cocaine  Plan -Wean propofol 11/30 -Watch for any evidence withdrawal -We will need outpatient counseling, rehab for addiction  Hyperglycemia, hemoglobin A1c 5.7 Glucose in urine  Plan -Continue sliding scale insulin as per protocol  Acute (?  On chronic) renal failure in the setting of rhabdomyolysis Lactic acidosis, quickly cleared with IV fluids Plan -Trend CK -Follow BMP with IV fluid resuscitation, decrease LR 50 cc/h on 11/30    Best Practice (right click and "Reselect all SmartList Selections" daily)   Diet/type: NPO DVT prophylaxis: prophylactic heparin  GI prophylaxis: H2B Lines: N/A Foley:  Yes, and it is still needed Code Status:  full code Last date of multidisciplinary goals of care discussion [Dr. Delton Coombes updated mother 11/30 at bedside. ]  Labs   CBC: Recent Labs  Lab 09/16/21 0906 09/16/21 0907 09/16/21 1005 09/17/21 0428 09/17/21 0455  WBC 26.2*  --   --  11.4*  --   HGB 14.3 15.0 11.9* 12.1* 11.2*  HCT 44.1 44.0 35.0* 34.9* 33.0*  MCV 97.6  --   --  91.1  --   PLT 328  --   --  193  --     Basic Metabolic Panel: Recent Labs  Lab 09/16/21 0906 09/16/21 0907 09/16/21 1005 09/16/21 1012 09/17/21 0428 09/17/21 0455  NA 139 140 137  --  133* 138  K 3.6 3.6 4.2  --  3.4* 3.3*  CL 100 101  --   --  102  --   CO2 19*  --   --   --  24  --   GLUCOSE 240*  230*  --   --  98  --   BUN 17 22*  --   --  9  --   CREATININE 1.68* 1.40*  --   --  1.22  --   CALCIUM 9.0  --   --   --  8.1*  --   MG  --   --   --  2.6* 2.0  --    GFR: Estimated Creatinine Clearance: 78.6 mL/min (by C-G formula based on SCr of 1.22 mg/dL). Recent Labs  Lab 09/16/21 0906 09/16/21 0947 09/16/21 1156 09/16/21 1829 09/17/21 0428 09/17/21 0500  PROCALCITON  --   --  0.54  --  1.91  --   WBC 26.2*  --   --   --  11.4*  --   LATICACIDVEN  --  >9.0*  --  1.3  --  1.3  Liver Function Tests: Recent Labs  Lab 09/16/21 0906  AST 51*  ALT 32  ALKPHOS 67  BILITOT 0.5  PROT 7.4  ALBUMIN 4.1   No results for input(s): LIPASE, AMYLASE in the last 168 hours. No results for input(s): AMMONIA in the last 168 hours.  ABG    Component Value Date/Time   PHART 7.531 (H) 09/17/2021 0455   PCO2ART 28.2 (L) 09/17/2021 0455   PO2ART 150 (H) 09/17/2021 0455   HCO3 23.4 09/17/2021 0455   TCO2 24 09/17/2021 0455   O2SAT 100.0 09/17/2021 0455     Coagulation Profile: Recent Labs  Lab 09/16/21 0940  INR 1.1    Cardiac Enzymes: Recent Labs  Lab 09/16/21 1012  CKTOTAL 424*    HbA1C: Hgb A1c MFr Bld  Date/Time Value Ref Range Status  09/16/2021 11:44 AM 5.7 (H) 4.8 - 5.6 % Final    Comment:    (NOTE)         Prediabetes: 5.7 - 6.4         Diabetes: >6.4         Glycemic control for adults with diabetes: <7.0     CBG: Recent Labs  Lab 09/16/21 1620 09/16/21 2008 09/17/21 0007 09/17/21 0400 09/17/21 0800  GLUCAP 101* 109* 110* 87 105*       Critical care time: 35 minutes     Levy Pupa, MD, PhD 09/17/2021, 8:29 AM [facility] [phone] or if no answer before 7:00PM call [phone] For any issues after 7:00PM please call eLink [phone]

## 2021-09-18 ENCOUNTER — Inpatient Hospital Stay (HOSPITAL_COMMUNITY): Payer: 59

## 2021-09-18 DIAGNOSIS — R569 Unspecified convulsions: Secondary | ICD-10-CM | POA: Diagnosis not present

## 2021-09-18 LAB — CBC
HCT: 34.6 % — ABNORMAL LOW (ref 39.0–52.0)
Hemoglobin: 11.9 g/dL — ABNORMAL LOW (ref 13.0–17.0)
MCH: 31.6 pg (ref 26.0–34.0)
MCHC: 34.4 g/dL (ref 30.0–36.0)
MCV: 92 fL (ref 80.0–100.0)
Platelets: 173 10*3/uL (ref 150–400)
RBC: 3.76 MIL/uL — ABNORMAL LOW (ref 4.22–5.81)
RDW: 12.4 % (ref 11.5–15.5)
WBC: 9.6 10*3/uL (ref 4.0–10.5)
nRBC: 0 % (ref 0.0–0.2)

## 2021-09-18 LAB — PHOSPHORUS: Phosphorus: 1.9 mg/dL — ABNORMAL LOW (ref 2.5–4.6)

## 2021-09-18 LAB — PROCALCITONIN: Procalcitonin: 1.02 ng/mL

## 2021-09-18 LAB — BASIC METABOLIC PANEL WITH GFR
Anion gap: 4 — ABNORMAL LOW (ref 5–15)
BUN: 9 mg/dL (ref 6–20)
CO2: 23 mmol/L (ref 22–32)
Calcium: 8.1 mg/dL — ABNORMAL LOW (ref 8.9–10.3)
Chloride: 110 mmol/L (ref 98–111)
Creatinine, Ser: 0.95 mg/dL (ref 0.61–1.24)
GFR, Estimated: >60 mL/min
Glucose, Bld: 128 mg/dL — ABNORMAL HIGH (ref 70–99)
Potassium: 4 mmol/L (ref 3.5–5.1)
Sodium: 137 mmol/L (ref 135–145)

## 2021-09-18 LAB — GLUCOSE, CAPILLARY: Glucose-Capillary: 117 mg/dL — ABNORMAL HIGH (ref 70–99)

## 2021-09-18 LAB — MAGNESIUM: Magnesium: 2 mg/dL (ref 1.7–2.4)

## 2021-09-18 LAB — CK: Total CK: 1108 U/L — ABNORMAL HIGH (ref 49–397)

## 2021-09-18 MED ORDER — LEVETIRACETAM 500 MG PO TABS: 1000.0000 mg | ORAL_TABLET | Freq: Two times a day (BID) | ORAL | 0 refills | Status: AC

## 2021-09-18 NOTE — Progress Notes (Signed)
DISCHARGE NOTE HOME Erik Hoffman to be discharged Home per MD order. Discussed prescriptions and follow up appointments with the patient. Prescriptions given to patient; medication list explained in detail. Patient verbalized understanding.  Skin clean, dry and intact without evidence of skin break down, no evidence of skin tears noted. IV catheter discontinued intact. Site without signs and symptoms of complications. Dressing and pressure applied. Pt denies pain at the site currently. No complaints noted.  Patient free of lines, drains, and wounds.   An After Visit Summary (AVS) was printed and given to the patient. Patient escorted via wheelchair, and discharged home via private auto.  Myrtis Hopping, RN

## 2021-09-18 NOTE — Plan of Care (Signed)
  Problem: Education: Goal: Knowledge of General Education information will improve Description: Including pain rating scale, medication(s)/side effects and non-pharmacologic comfort measures Outcome: Adequate for Discharge   

## 2021-09-18 NOTE — Progress Notes (Signed)
Discharge instructions (including medications) discussed with and copy provided to patient/caregiver 

## 2021-09-18 NOTE — Discharge Summary (Signed)
Physician Discharge Summary      Patient ID: Erik Hoffman MRN: [id number] DOB/AGE: 1981/07/27 39 y.o.  Admit date: 09/16/2021 Discharge date: 09/18/2021  Discharge Diagnoses:   Uncontrolled Seizure disorder , Polysubstance abuse  AMS Recently started on Keppra Failure to Protect Airway requiring Intubation in ED Hx. Of asthma  Leukocytosis T Max 101.8 Polysubstance abuse  Benzo/ opioid / Cocaine  Hyperglycemia, hemoglobin A1c 5.7 Glucose in urine  Acute (?  On chronic) renal failure in the setting of rhabdomyolysis Lactic acidosis, quickly cleared with IV fluids  Discharge summary   40 year old male with a history of poly-substance abuse, asthma, and seizure disorder  was found on the kitchen floor with contusion to head , altered mental status and incontinent. Last known normal at 3:30 am . EMS reports that on their arrival he was altered with decreased responsiveness. They noted a contusion to his head, and pinpoint pupils.  He came agitated and confused  in the field after receiving intra nasal Narcan and received a total of 12.5 mg Versed and Haldol 5 mg IM as he was combative. (He received 1 dose of narcan intra-nasally and one IV).It took several Tree surgeon to restrain patient.    Pt has a history of seizure disorder, unsure of compliance. Per family patient was recently started on Keppra , but has had breakthrough seizures. He sees Erik Hoffman. Neurologist for his seizure disorder. Per family he is very secretive about his drug use. Drugs of choice are Adderall, and Xanax, Cocaine and opioids more recently.    In the ED patient required intubation to protect his airway. He waxed and waned between unresponsiveness  and combativeness. He was intubated  for a definitive airway, and he is currently sedated on Versed and Propofol . CT head , Abdomen and pelvis and cervical spine were negative .   As patient was intubated and sedated, PCCM have been asked to  admit and manage care   Patient was seen by neurology 11/29 and EEG completed and negative for acute seizure, head CT also within normal limits.  Patient was able to tolerate extubation 11/30 with no acute complications.Neurology recommends increasing Keppra dose 2000 mg twice daily and signed off 11/30.  12/1 patient remained stable, tolerating oral meds, ambulating per baseline with no recurrent seizures.  Therefore, patient will be discharged home.  Order written for increased Keppra dosing upon discharge.  Patient understands need to follow-up with outpatient neurology upon discharge.  Patient also understand driving restrictions with epilepsy per neurology.  Discharge Plan by Active Problems   Uncontrolled Seizure disorder, Polysubstance abuse  AMS Recently started on Keppra Plan Neurology evaluated during admission Recommendations made to increase p.o. Keppra to 1000 mg twice daily upon discharge Patient understands need to follow-up with outpatient neurology upon discharge Patient understands driving restrictions with epilepsy history Cessation education provided   Failure to Protect Airway requiring Intubation in ED -Tolerated extubation day after arrival Hx. Of asthma  Plan Remained stable on room air Continued encouragement for pulmonary hygiene upon discharge Educated on need to avoid illicit drug use and oversedation   Leukocytosis T Max 101.9 -Unasyn considered on 11/29 given fever and risk for aspiration pneumonia, but not ordered as chest x-ray reassuring.  Plan WBC down trended with supportive care Procalcitonin with flat trend Blood cultures with no growth to date   Polysubstance abuse  Benzo/ opioid / Cocaine  Plan Cessation education provided Patient would benefit from outpatient rehab   Hyperglycemia,  hemoglobin A1c 5.7 Glucose in urine  Plan Patient educated on need to follow-up with primary care physician upon discharge No need for subcu insulin  prescription upon discharge   Acute (?  On chronic) renal failure in the setting of rhabdomyolysis Lactic acidosis, quickly cleared with IV fluids Plan Creatinine on admission elevated at 1.40 with supportive care during admission creatinine has down trended to 0.95   Significant Hospital tests/ studies  CT chest abdomen and pelvis within normal limits And mild scalp hematoma with underlying skull fracture  Procedures   11/29 intubated > extubated 11/30  Culture data/antimicrobials   11/29 blood cultures remain negative to date   Consults  Neurology  Discharge Exam: BP 136/81 (BP Location: Right Arm)   Pulse (!) 58   Temp 98.5 F (36.9 C) (Oral)   Resp 17   Ht 5\' 8"  (1.727 m)   Wt 70.2 kg   SpO2 100%   BMI 23.53 kg/m   General: Pleasant adult male sitting up on edge of bed in NAD HEENT: [place]/AT, MM pink/moist, PERRL,  Neuro: Alert and oriented x3 CV: s1s2 regular rate and rhythm, no murmur, rubs, or gallops,  PULM:  Clear to ascultation, no increased work fo breathing, on RA GI: soft, bowel sounds active in all 4 quadrants, non-tender, non-distended, tolerating oral diet  Extremities: warm/dry, no edema  Skin: no rashes or lesions   Labs at discharge   Lab Results  Component Value Date   CREATININE 0.95 09/18/2021   BUN 9 09/18/2021   NA 137 09/18/2021   K 4.0 09/18/2021   CL 110 09/18/2021   CO2 23 09/18/2021   Lab Results  Component Value Date   WBC 9.6 09/18/2021   HGB 11.9 (L) 09/18/2021   HCT 34.6 (L) 09/18/2021   MCV 92.0 09/18/2021   PLT 173 09/18/2021   Lab Results  Component Value Date   ALT 32 09/16/2021   AST 51 (H) 09/16/2021   ALKPHOS 67 09/16/2021   BILITOT 0.5 09/16/2021   Lab Results  Component Value Date   INR 1.1 09/16/2021   INR 0.9 10/12/2008    Current radiological studies    DG Chest Port 1 View  Result Date: 09/18/2021 CLINICAL DATA:  Hypoxia and respiratory failure. EXAM: PORTABLE CHEST 1 VIEW COMPARISON:  Portable  chest yesterday at 5:38 a.m. FINDINGS: 4:15 a.m. on 09/18/2021. Heart size and vasculature are normal. The lungs are hyperexpanded but clear. No pleural effusion is seen. The thoracic cage is intact. Interval extubation and removal NGT. In all other respects no further changes. IMPRESSION: No evidence of acute chest disease. Hyperinflated with interval extubation. Electronically Signed   By: 14/10/2020 M.D.   On: 09/18/2021 05:42   DG Chest Port 1 View  Result Date: 09/17/2021 CLINICAL DATA:  Respiratory failure EXAM: PORTABLE CHEST 1 VIEW COMPARISON:  09/16/2021 FINDINGS: Endotracheal tube tip is approximately 7 cm above the carina. Enteric tube passes into stomach with tip out of field of view. The heart size and mediastinal contours are within normal limits. Both lungs are clear. No pleural effusion. No pneumothorax. The visualized skeletal structures are unremarkable. IMPRESSION: No acute process in the chest. Electronically Signed   By: 09/18/2021 M.D.   On: 09/17/2021 08:18   EEG adult  Result Date: 09/16/2021 09/18/2021, MD     09/16/2021  1:29 PM Patient Name: Erik Hoffman MRN: Len Blalock Epilepsy Attending: [id number] Referring Physician/Provider: Dr. Charlsie Quest Date: 09/16/2021 Duration: 23.35  mins Patient history: 40 year old male found down after possible seizure and drug use.  EEG to evaluate for seizures. Level of alertness: comatose AEDs during EEG study: Propofol Technical aspects: This EEG study was done with scalp electrodes positioned according to the 10-20 International system of electrode placement. Electrical activity was acquired at a sampling rate of 500Hz  and reviewed with a high frequency filter of 70Hz  and a low frequency filter of 1Hz . EEG data were recorded continuously and digitally stored. Description: EEG showed continuous generalized 3 to 5 Hz theta-delta slowing. There is also an excessive amount of 15 to 18 Hz beta activity with irregular  morphology distributed symmetrically and diffusely. Hyperventilation and photic stimulation were not performed.   ABNORMALITY - Continuous slow, generalized - Excessive beta, generalized IMPRESSION: This study is suggestive of severe diffuse encephalopathy, nonspecific to etiology but most likely due to sedation.  No seizures or epileptiform discharges were seen throughout the recording.    Disposition:  Home      Allergies as of 09/18/2021   No Known Allergies      Medication List     TAKE these medications    acetaminophen 500 MG tablet Commonly known as: TYLENOL Take 500-1,000 mg by mouth every 6 (six) hours as needed for mild pain, moderate pain or headache.   Aleve 220 MG tablet Generic drug: naproxen sodium Take 220-440 mg by mouth 2 (two) times daily as needed (for mild pain or headaches).   levETIRAcetam 500 MG tablet Commonly known as: KEPPRA Take 2 tablets (1,000 mg total) by mouth 2 (two) times daily. What changed: how much to take         Follow-up appointment   PCP and primary neurology  Discharge Condition:     Signed: Caellum Mancil D. , NP-C [facility] Personal contact information can be found on Amion  09/18/2021, 10:09 AM

## 2021-09-18 NOTE — Evaluation (Signed)
Occupational Therapy Evaluation Patient Details Name: Erik Hoffman MRN: [id number] DOB: 02/14/1981 Today's Date: 09/18/2021   History of Present Illness 40 y.o. male present to ED 11/29 with encephalopathy, head contusion and possible aspiration after being found down at home secondary to possible seizure. Patient also found to have hypoglycemia, acute hypoxemic and hypercapnic respiratory failure and rhabdomyolysis. Intubated in ED and extubated 11/30. PMHx significant for seizures x2 3 days PTA ron keppra but noncompliant and polysubstance abuse.   Clinical Impression   PTA patient was living with his girlfriend in a 2nd floor apartment and was grossly I with ADLs/IADLs. Patient reports losing his job during the pandemic and has not worked since. Patient currently functioning at baseline demonstrating observed ADLs, functional mobility and stair negotiation with I. Patient reports driving intermittently. Education provided on driving cessation 2/2 seizure disorder. Patient expressed verbal understanding. Strength, sensation and vision appear WFL. Noted BUE swelling. RN made aware. Education on positioning for edema management. Patient does not require continued acute occupational therapy services with OT to sign off at this time.     Recommendations for follow up therapy are one component of a multi-disciplinary discharge planning process, led by the attending physician.  Recommendations may be updated based on patient status, additional functional criteria and insurance authorization.   Follow Up Recommendations  No OT follow up    Assistance Recommended at Discharge PRN  Functional Status Assessment  Patient has not had a recent decline in their functional status  Equipment Recommendations  None recommended by OT    Recommendations for Other Services       Precautions / Restrictions Precautions Precautions: Other (comment) Precaution Comments: Seizures Restrictions Weight  Bearing Restrictions: No      Mobility Bed Mobility Overal bed mobility: Independent                  Transfers Overall transfer level: Independent                        Balance Overall balance assessment: Independent                                         ADL either performed or assessed with clinical judgement   ADL Overall ADL's : Independent                                             Vision Baseline Vision/History:  (Contacts) Ability to See in Adequate Light: 0 Adequate Patient Visual Report: No change from baseline Vision Assessment?: No apparent visual deficits     Perception     Praxis      Pertinent Vitals/Pain Pain Assessment: Faces Faces Pain Scale: Hurts a little bit Pain Location: L PIV site Pain Descriptors / Indicators: Sore Pain Intervention(s): Monitored during session     Hand Dominance     Extremity/Trunk Assessment Upper Extremity Assessment Upper Extremity Assessment: Overall WFL for tasks assessed   Lower Extremity Assessment Lower Extremity Assessment: Overall WFL for tasks assessed   Cervical / Trunk Assessment Cervical / Trunk Assessment: Normal   Communication Communication Communication: No difficulties   Cognition Arousal/Alertness: Awake/alert Behavior During Therapy: WFL for tasks assessed/performed Overall Cognitive Status: Within Functional Limits for tasks assessed  General Comments  VSS on RA    Exercises     Shoulder Instructions      Home Living Family/patient expects to be discharged to:: Private residence Living Arrangements: Spouse/significant other (Girlfriend) Available Help at Discharge: Family;Available PRN/intermittently Type of Home: Apartment (2nd floor) Home Access: Stairs to enter Entrance Stairs-Number of Steps: 2 flights with bilateral handrails Entrance Stairs-Rails: Right;Left Home  Layout: One level     Bathroom Shower/Tub: Chief Strategy Officer: Standard     Home Equipment: None          Prior Functioning/Environment Prior Level of Function : Independent/Modified Independent               ADLs Comments: Not currently working; reports driving intermittently; education on driving cessation secondary to seizures.        OT Problem List:        OT Treatment/Interventions:      OT Goals(Current goals can be found in the care plan section) Acute Rehab OT Goals Patient Stated Goal: To return home OT Goal Formulation: With patient  OT Frequency:     Barriers to D/C:            Co-evaluation              AM-PAC OT "6 Clicks" Daily Activity     Outcome Measure Help from another person eating meals?: None Help from another person taking care of personal grooming?: None Help from another person toileting, which includes using toliet, bedpan, or urinal?: None Help from another person bathing (including washing, rinsing, drying)?: None Help from another person to put on and taking off regular upper body clothing?: None Help from another person to put on and taking off regular lower body clothing?: None 6 Click Score: 24   End of Session Equipment Utilized During Treatment: Gait belt Nurse Communication: Mobility status;Other (comment) (BUE edema)  Activity Tolerance: Patient tolerated treatment well Patient left: in chair;with call bell/phone within reach  OT Visit Diagnosis: Muscle weakness (generalized) (M62.81)                Time: 2993-7169 OT Time Calculation (min): 16 min Charges:  OT General Charges $OT Visit: 1 Visit OT Evaluation $OT Eval Low Complexity: 1 Low  Damonta Cossey H. OTR/L Supplemental OT, Department of rehab services [phone]  Samarra Ridgely R H. 09/18/2021, 7:41 AM

## 2021-09-18 NOTE — Progress Notes (Signed)
Received transfer pt. From 4N via wheelchair escorted by RN. Ushered to room and make patient comfortable in bed. Pt A&Ox4, NAD, VSS, no complaints made. belongings( cellphone, food) with patient. Needs assisted and met.Oriented to room and staff. Call bell and room phone placed with in reached.

## 2021-09-18 NOTE — Progress Notes (Signed)
PT Cancellation Note  Patient Details Name: Erik Hoffman MRN: [id number] DOB: Feb 23, 1981   Cancelled Treatment:    Reason Eval/Treat Not Completed: PT screened, no needs identified, will sign off. Per discussion with OT the patient is able to perform all mobility required in the home setting independently at this time. Pt denies concerns about mobility currently and declines PT evaluation. Acute PT signing off.   Arlyss Gandy 09/18/2021, 9:40 AM

## 2021-09-21 LAB — CULTURE, BLOOD (ROUTINE X 2)
Culture: NO GROWTH
Culture: NO GROWTH
Special Requests: ADEQUATE
Special Requests: ADEQUATE

## 2021-10-01 ENCOUNTER — Telehealth: Payer: Self-pay | Admitting: Emergency Medicine

## 2021-10-01 NOTE — Telephone Encounter (Signed)
Left message for patient's mother to call back. Patient has never been seen in our office before. Will need to reach out to his PCP or neurologist for management.

## 2021-10-02 ENCOUNTER — Ambulatory Visit (HOSPITAL_COMMUNITY)
Admission: EM | Admit: 2021-10-02 | Discharge: 2021-10-02 | Disposition: A | Discharge status: Home / Self Care | Payer: 59 | Attending: Behavioral Health | Admitting: Behavioral Health

## 2021-10-02 DIAGNOSIS — F321 Major depressive disorder, single episode, moderate: Secondary | ICD-10-CM | POA: Diagnosis not present

## 2021-10-02 DIAGNOSIS — F19982 Other psychoactive substance use, unspecified with psychoactive substance-induced sleep disorder: Secondary | ICD-10-CM

## 2021-10-02 MED ORDER — TRAZODONE HCL 50 MG PO TABS: 50.0000 mg | ORAL_TABLET | Freq: Every evening | ORAL | 0 refills | Status: AC | PRN

## 2021-10-02 NOTE — Discharge Instructions (Addendum)
Discharge recommendations:  Follow up with Neurologist if worsening insomnia continues occurs. Patient is to take medications as prescribed. Please see information for follow-up appointment with psychiatry and therapy. Please follow up with your primary care provider for all medical related needs.    Therapy: We recommend that patient participate in individual therapy to address mental health concerns.  Medications: The parent/guardian is to contact a medical professional and/or outpatient provider to address any new side effects that develop. Parent/guardian should update outpatient providers of any new medications and/or medication changes.   Atypical antipsychotics: If you are prescribed an atypical antipsychotic, it is recommended that your height, weight, BMI, blood pressure, fasting lipid panel, and fasting blood sugar be monitored by your outpatient providers.  Safety:  The patient should abstain from use of illicit substances/drugs and abuse of any medications. If symptoms worsen or do not continue to improve or if the patient becomes actively suicidal or homicidal then it is recommended that the patient return to the closest hospital emergency department, the [facility], or call 911 for further evaluation and treatment. National Suicide Prevention Lifeline 1-800-SUICIDE or 616-827-7528.  About 988 988 offers 24/7 access to trained crisis counselors who can help people experiencing mental health-related distress. People can call or text 988 or chat 988lifeline.org for themselves or if they are worried about a loved one who may need crisis support.   Please contact one of the following facilities to start medication management and therapy services:   [facility] [address]  [place], [place] [address] [phone]   [facility]  [address] [place], [place] [address] [phone]  [facility] [place]  [address] [place], [place] [address] [phone]  [facility]  [address]  [place], [place] [address] [phone]  [facility]  [address] [place], [place] [address] [phone]  [facility]  [address],  [place], [place] [address] [phone]

## 2021-10-02 NOTE — ED Provider Notes (Signed)
Behavioral Health Urgent Care Medical Screening Exam  Patient Name: Erik Hoffman MRN: [id number] Date of Evaluation: 10/02/21 Diagnosis:  Final diagnoses:  Current moderate episode of major depressive disorder without prior episode (HCC)  Drug-induced insomnia (HCC)    History of Present illness: Erik Hoffman is a 40 y.o. male patient presented to [facility] as a walk in accompanied by his girlfriend with complaints of depression and insomnia,.  Erik Hoffman, 40 y.o., male patient seen face to face by this provider, consulted with Dr. Bronwen Betters; and chart reviewed on 10/02/21. On evaluation Erik Hoffman is alert and oriented x4. His thought process is logical and he is goal oriented. His speech is clear and coherent. His mood is depressed and affect is congruent. He appears well groomed and is casually dressed.  He reports having a bad seizure on September 16, 2021. He states that he was found unresponsive and had to be given Narcan. He states that he was prescribed Keppra 1000 mg daily and it was increased to 1000 mg twice daily during his hospitalization. He states that since the increase in Keppra 3 weeks ago,  he has been feeling increasingly depressed and not sleeping well. He describes his depressive symptoms as sadness, worthlessness, hopelessness, decreased energy level, crying spells, isolating, and irritability. He reports worsening poor sleep. He reports that he sleeps about 3 to 4 hours per night. He reports racing thoughts at night. He reports feeling increasingly agitated for the past couple of weeks and states that he hits the wall or furniture when he is agitated. He reports a hx of aggression since June, 2022.  He denies suicidal and homicidal ideations. He denies auditory and visual hallucinations. There is no objective evidence that he is responding to internal or external stimuli.  He reports that he resides with his girlfriend.  He is currently unemployed.   He denies drinking alcohol. He reports smoking marijuana daily, on average 8 ounces per day.  He reports smoking marijuana since age 35. He denies using other illicit drugs. When asked if he was found unresponsive and Narcan due to using opiate use, he states no. He denies a past history of mental health problems. He denies current psychiatry or therapy. He denies a past history of psych hospitalizations.  I discussed with the patient starting trazodone 50 mg po nightly as needed for insomnia/depression.  Patient states that he took trazodone in the past during a previous hospitalization after having a seizure years ago. He states that he tolerated the Trazodone without any side effects. He states that the Trazodone made him feel a little groggy if he took it at 12 am. He states that he would like to restart trazodone for sleep. I discussed with the patient Trazodone side effects and if he develops a painful erection lasting more than one hour to seek medical attention. Patient verbalizes understanding. Patient encouraged to follow up with his Neurologist if insomnia/mood continues to worsen with increase in Keppra.     Psychiatric Specialty Exam  Presentation  General Appearance:Appropriate for Environment  Eye Contact:Fair  Speech:Clear and Coherent  Speech Volume:Normal  Handedness:No data recorded  Mood and Affect  Mood:Depressed  Affect:Congruent   Thought Process  Thought Processes:Coherent; Goal Directed  Descriptions of Associations:Intact  Orientation:Full (Time, Place and Person)  Thought Content:Logical    Hallucinations:None  Ideas of Reference:None  Suicidal Thoughts:No  Homicidal Thoughts:No   Sensorium  Memory:Immediate Fair; Remote Fair; Recent Fair  Judgment:Fair  Insight:Fair  Executive Functions  Concentration:Fair  Attention Span:Fair  Recall:Fair  [employer] of Knowledge:Fair  Language:Fair   Psychomotor Activity  Psychomotor  Activity:Normal   Assets  Assets:Communication Skills; Desire for Improvement; Financial Resources/Insurance; Housing; Intimacy; Leisure Time; Physical Health; Social Support; Transportation   Sleep  Sleep:Poor  Number of hours: 4   Physical Exam: Physical Exam Constitutional:      Appearance: Normal appearance.  HENT:     Head: Normocephalic and atraumatic.     Nose: Nose normal.  Eyes:     Conjunctiva/sclera: Conjunctivae normal.  Cardiovascular:     Rate and Rhythm: Normal rate.  Pulmonary:     Effort: Pulmonary effort is normal.  Musculoskeletal:        General: Normal range of motion.     Cervical back: Normal range of motion.  Neurological:     Mental Status: He is alert and oriented to person, place, and time.   Review of Systems  Constitutional: Negative.   HENT: Negative.    Eyes: Negative.   Respiratory: Negative.    Cardiovascular: Negative.   Gastrointestinal: Negative.   Genitourinary: Negative.   Musculoskeletal: Negative.   Skin: Negative.   Neurological:        "Hx of seizures."  Endo/Heme/Allergies: Negative.   Blood pressure 118/75, pulse 65, temperature 98.5 F (36.9 C), temperature source Oral, resp. rate 18, SpO2 100 %. There is no height or weight on file to calculate BMI.  Musculoskeletal: Strength & Muscle Tone: within normal limits Gait & Station: normal Patient leans: N/A   [facility] MSE Discharge Disposition for Follow up and Recommendations: Based on my evaluation the patient does not appear to have an emergency medical condition and can be discharged with resources and follow up care in outpatient services for Medication Management, Individual Therapy, and Group Therapy  Follow up recommendations for depression:  Please contact one of the following facilities to start medication management and therapy services:   [facility] [address] [place] [address]  [place], [place] [address] [phone]    [facility]  [address] [place] [address] [place], [place] [address] [phone]  [facility] [place]  [address] [place], [place] [address] [phone]  [facility]  [address]  [place], [place] [address] [phone]  [facility]  [address] [place], [place] [address] [phone]  [facility]  [address],  [place], [place] [address] [phone]   Follow-up Information     [facility]   Specialty: Urgent Care Why: call for an appointment if insurance runs out Contact information: [address] [place] [address] [phone]               Insomnia:  A 30-day prescription sent to [facility] for Trazodone 50 mg po QHS as needed for insomnia.   Carsen Machi L, NP 10/02/2021, 2:16 PM

## 2021-10-02 NOTE — ED Notes (Signed)
Pt given AVS and follow up instructions.  Verbalized understanding on obtaining medication and F/U.   No distress noted.   Escorted off unit without incident.

## 2021-10-02 NOTE — BH Assessment (Signed)
Pt presents to [facility] with worsening depression and anxiety symptoms. Pt states that he has been struggling with depression as a result of medication that was prescribed for his recent seizure on 09/16/21. Pt stated that his medication was doubled to 2000mg  and it has been causing him to feel extremely depressed. Pt is seeking assistance with medication management and outpatient therapy for his depression and anxiety symptoms. Pt states that he is unable to get an appointment with his PCP at this time and came to the [facility] to "speed up the process". Pt stated that he is struggling with lack of sleep and he is in need of a "mood stabilizer". Pt states that he use THC daily. Pt denies SI/HI and AVH. Pt is routine.

## 2021-10-07 NOTE — Telephone Encounter (Signed)
Pt ended up going to [facility] ED due to his depression and they were able to get meds changed. Nothing further needed.

## 2021-10-13 ENCOUNTER — Telehealth (HOSPITAL_COMMUNITY): Payer: Self-pay

## 2021-10-13 NOTE — BH Assessment (Signed)
Care Management - [facility] Follow Up Discharges  ° °Writer attempted to make contact with patient today and was unsuccessful.  Writer left a HIPPA compliant voice message.  ° °Per chart review, patient was provided with outpatient resources. ° °

## 2021-12-16 ENCOUNTER — Ambulatory Visit (HOSPITAL_COMMUNITY)
Admission: EM | Admit: 2021-12-16 | Discharge: 2021-12-16 | Disposition: A | Discharge status: Home / Self Care | Payer: No Payment, Other

## 2021-12-17 ENCOUNTER — Ambulatory Visit (HOSPITAL_COMMUNITY)
Admission: EM | Admit: 2021-12-17 | Discharge: 2021-12-17 | Disposition: A | Discharge status: Home / Self Care | Payer: No Payment, Other

## 2021-12-17 NOTE — ED Notes (Signed)
Pt left after being triaged. He was upset he waited 2 hours to be seen he stated he just wanted a medication refill. He stated he will try back tomorrow  ?

## 2021-12-17 NOTE — BH Assessment (Signed)
Pt reports he is about to run out of his seizure medications and want to know if he can get a refill. Pt denies SI, HI, AVH. Pt is routine.  ?

## 2021-12-25 ENCOUNTER — Telehealth (HOSPITAL_COMMUNITY): Payer: Self-pay

## 2021-12-25 NOTE — BH Assessment (Signed)
Care Management - [facility] Follow Up Discharges   Writer attempted to make contact with patient today and was unsuccessful.  Writer left a HIPPA compliant voice message.   Per chart review, patient left AMA.   

## 2023-03-07 IMAGING — DX DG CHEST 1V PORT
1 series · 2 of 2 positions shown · non-contrast
Comparison: Portable chest yesterday at [DATE] a.m.

CLINICAL DATA: Hypoxia and respiratory failure.

EXAM:
PORTABLE CHEST 1 VIEW

[Series 1: chest · 0.14mm/px · 2 of 2 slices shown]
[im 1/2]
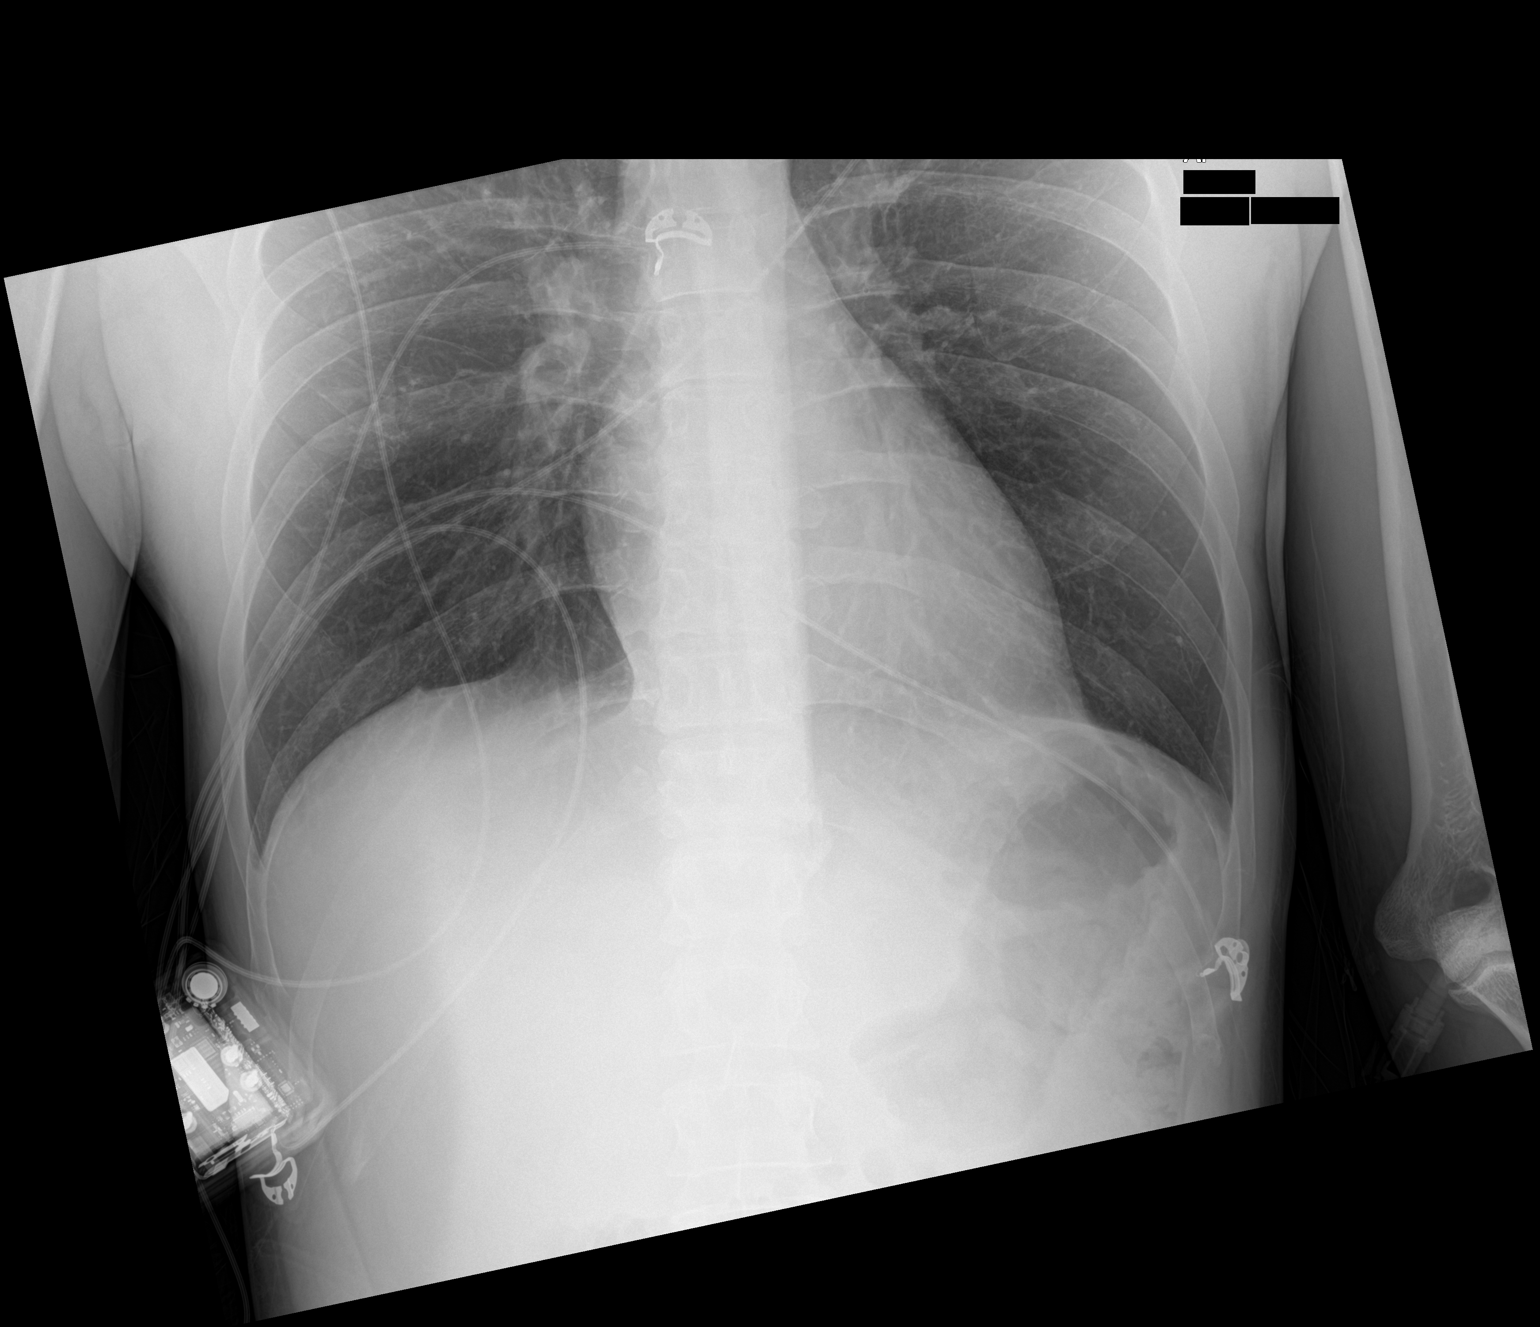
[im 2/2]
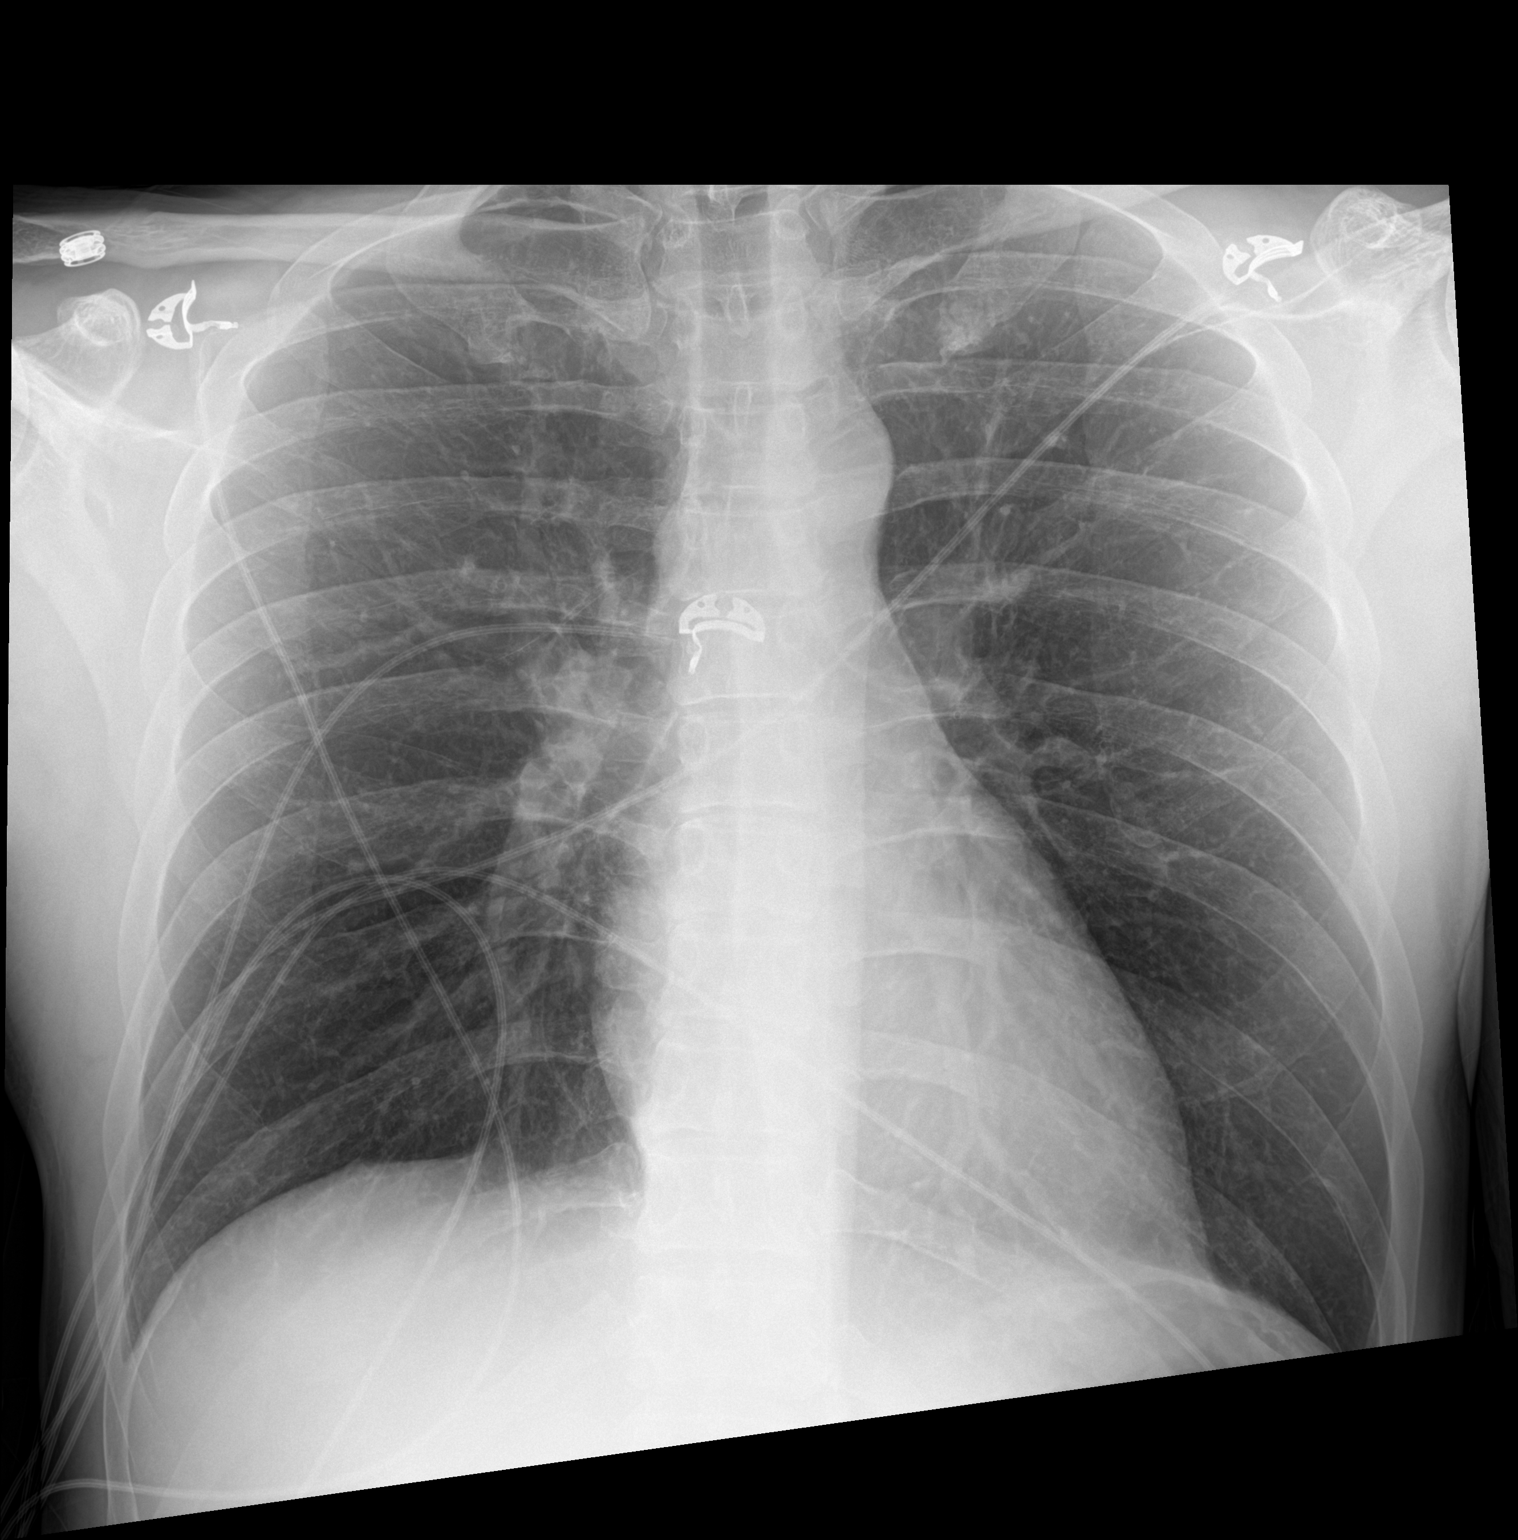

[2 of 2 positions shown; findings below may reference images not displayed]

FINDINGS: [DATE] a.m. on 09/18/2021. Heart size and vasculature are normal. The
lungs are hyperexpanded but clear. No pleural effusion is seen. The
thoracic cage is intact.

Interval extubation and removal NGT. In all other respects no
further changes.
IMPRESSION: No evidence of acute chest disease. Hyperinflated with interval
extubation.
